# Patient Record
Sex: Female | Born: 1985 | Race: Black or African American | Hispanic: No | Marital: Single | Smoking: Former smoker
Health system: Southern US, Community
[De-identification: ages and names within clinical notes are randomized; demographics above are authoritative.]

## PROBLEM LIST (undated history)

## (undated) HISTORY — PX: TUBAL LIGATION: SHX77

---

## 2015-05-20 ENCOUNTER — Emergency Department (HOSPITAL_COMMUNITY)
Admission: EM | Admit: 2015-05-20 | Discharge: 2015-05-20 | Disposition: A | Discharge status: Home / Self Care | Payer: Medicaid Other | Attending: Emergency Medicine | Admitting: Emergency Medicine

## 2015-05-20 ENCOUNTER — Encounter (HOSPITAL_COMMUNITY): Payer: Self-pay | Admitting: Emergency Medicine

## 2015-05-20 DIAGNOSIS — K0889 Other specified disorders of teeth and supporting structures: Secondary | ICD-10-CM | POA: Diagnosis not present

## 2015-05-20 DIAGNOSIS — K002 Abnormalities of size and form of teeth: Secondary | ICD-10-CM | POA: Diagnosis not present

## 2015-05-20 DIAGNOSIS — K029 Dental caries, unspecified: Secondary | ICD-10-CM | POA: Diagnosis not present

## 2015-05-20 MED ORDER — PENICILLIN V POTASSIUM 500 MG PO TABS: 500.0000 mg | ORAL_TABLET | Freq: Four times a day (QID) | ORAL | Status: AC

## 2015-05-20 MED ORDER — NAPROXEN 500 MG PO TABS: 500.0000 mg | ORAL_TABLET | Freq: Two times a day (BID) | ORAL | Status: DC

## 2015-05-20 NOTE — ED Provider Notes (Signed)
CSN: [id number]     Arrival date & time 05/20/15  1549 History  By signing my name below, I, Budd PalmerVanessa Prueter, attest that this documentation has been prepared under the direction and in the presence of Bear StearnsKayla Caeley Dohrmann, PA-C. Electronically Signed: Budd PalmerVanessa Prueter, ED Scribe. 05/20/2015. 4:30 PM.     Chief Complaint  Patient presents with  . Dental Pain    The patient said she has been having dental pain for two weeks.  It is two upper molars on each side.  She rates her pain 10/10.   The history is provided by the patient. No language interpreter was used.   HPI Comments: Jessica Owen is a 29 y.o. female who presents to the Emergency Department complaining of constant, aching, bilateral upper molar pain radiating up into her temples onset 2 weeks ago. She reports associated gum throbbing and difficulty sleeping due to the pain. She notes exacerbation of the pain with eating and drinking. She reports having tried tylenol and rinsing with salt water, with no relief. She states that she has insurance, but does not have a dentist. Pt denies rhinorrhea, drooling, choking, difficulty swallowing, neck pain, and neck stiffness.  Pt has NKDA.   History reviewed. No pertinent past medical history. History reviewed. No pertinent past surgical history. History reviewed. No pertinent family history. Social History  Substance Use Topics  . Smoking status: Never Smoker   . Smokeless tobacco: Never Used  . Alcohol Use: No   OB History    No data available     Review of Systems A complete 10 system review of systems was obtained and all systems are negative except as noted in the HPI and PMH.   Allergies  Review of patient's allergies indicates not on file.  Home Medications   Prior to Admission medications   Not on File   BP 122/81 mmHg  Pulse 69  Temp(Src) 98.7 F (37.1 C) (Oral)  Resp 16  Ht 5\' 9"  (1.753 m)  Wt 235 lb (106.595 kg)  BMI 34.69 kg/m2  SpO2 100%  LMP 04/19/2015 Physical  Exam  Constitutional: She is oriented to person, place, and time. She appears well-developed and well-nourished.  HENT:  Head: Normocephalic and atraumatic.  Right Ear: External ear normal.  Left Ear: External ear normal.  Nose: Nose normal.  Mouth/Throat: Uvula is midline, oropharynx is clear and moist and mucous membranes are normal. No trismus in the jaw. Abnormal dentition (Patient is missing multiple upper molars with erythema, and TTP.  No active drainage visualized). Dental caries present. No uvula swelling. No oropharyngeal exudate, posterior oropharyngeal edema, posterior oropharyngeal erythema or tonsillar abscesses.  No facial swelling.   Eyes: Conjunctivae are normal. Pupils are equal, round, and reactive to light. Right eye exhibits no discharge. Left eye exhibits no discharge.  Neck: Normal range of motion. Neck supple.  No signs of Ludwigs angina  Cardiovascular: Normal rate, regular rhythm and normal heart sounds.   Pulmonary/Chest: Effort normal and breath sounds normal. No respiratory distress. She has no wheezes. She has no rales.  Abdominal: Soft. Bowel sounds are normal. She exhibits no distension.  Musculoskeletal: Normal range of motion.  Lymphadenopathy:    She has no cervical adenopathy.  Neurological: She is alert and oriented to person, place, and time. Coordination normal.  Skin: Skin is warm and dry. No rash noted. She is not diaphoretic. No erythema.  Psychiatric: She has a normal mood and affect.  Nursing note and vitals reviewed.   ED Course  Procedures  DIAGNOSTIC STUDIES: Oxygen Saturation is 100% on RA, normal by my interpretation.    COORDINATION OF CARE: 4:27 PM - Discussed plans to order antibiotics and naproxen. Will refer to a dentist. Pt advised of plan for treatment and pt agrees.  Labs Review Labs Reviewed - No data to display  Imaging Review No results found. I have personally reviewed and evaluated these images and lab results as part  of my medical decision-making.   EKG Interpretation None      MDM   Final diagnoses:  Pain, dental    Suspect dental pain associated with dental infection and possible dental abscess with patient afebrile, non toxic appearing and swallowing secretions well. I gave patient referral to dentist and stressed the importance of dental follow up for ultimate management of dental pain. Discussed return precautions.  Patient expresses understanding and agrees with plan.  I will also give penicillin VK and pain control.    I personally performed the services described in this documentation, which was scribed in my presence. The recorded information has been reviewed and is accurate.   Cheri Fowler, PA-C 05/20/15 1738  Rolland Porter, MD 05/27/15 [phone]

## 2015-05-20 NOTE — Discharge Instructions (Signed)
Dental Pain  ° ° °Dental pain may be caused by many things, including:  °Tooth decay (cavities or caries). Cavities expose the nerve of your tooth to air and hot or cold temperatures. This can cause pain or discomfort.  °Abscess or infection. A dental abscess is a collection of infected pus from a bacterial infection in the inner part of the tooth (pulp). It usually occurs at the end of the tooth's root.  °Injury.  °An unknown reason (idiopathic). °Your pain may be mild or severe. It may only occur when:  °You are chewing.  °You are exposed to hot or cold temperature.  °You are eating or drinking sugary foods or beverages, such as soda or candy. °Your pain may also be constant.  °HOME CARE INSTRUCTIONS  °Watch your dental pain for any changes. The following actions may help to lessen any discomfort that you are feeling:  °Take medicines only as directed by your dentist.  °If you were prescribed an antibiotic medicine, finish all of it even if you start to feel better.  °Keep all follow-up visits as directed by your dentist. This is important.  °Do not apply heat to the outside of your face.  °Rinse your mouth or gargle with salt water if directed by your dentist. This helps with pain and swelling.  °You can make salt water by adding ¼ tsp of salt to 1 cup of warm water. °Apply ice to the painful area of your face:  °Put ice in a plastic bag.  °Place a towel between your skin and the bag.  °Leave the ice on for 20 minutes, 2-3 times per day. °Avoid foods or drinks that cause you pain, such as:  °Very hot or very cold foods or drinks.  °Sweet or sugary foods or drinks. °SEEK MEDICAL CARE IF:  °Your pain is not controlled with medicines.  °Your symptoms are worse.  °You have new symptoms. °SEEK IMMEDIATE MEDICAL CARE IF:  °You are unable to open your mouth.  °You are having trouble breathing or swallowing.  °You have a fever.  °Your face, neck, or jaw is swollen. °This information is not intended to replace advice  given to you by your health care provider. Make sure you discuss any questions you have with your health care provider.  °Document Released: 06/19/2005 Document Revised: 11/03/2014 Document Reviewed: 06/15/2014  °Elsevier Interactive Patient Education ©2016 Elsevier Inc.  ° ° °Emergency Department Resource Guide °1) Find a Doctor and Pay Out of Pocket °Although you won't have to find out who is covered by your insurance plan, it is a good idea to ask around and get recommendations. You will then need to call the office and see if the doctor you have chosen will accept you as a new patient and what types of options they offer for patients who are self-pay. Some doctors offer discounts or will set up payment plans for their patients who do not have insurance, but you will need to ask so you aren't surprised when you get to your appointment. ° °2) Contact Your Local Health Department °Not all health departments have doctors that can see patients for sick visits, but many do, so it is worth a call to see if yours does. If you don't know where your local health department is, you can check in your phone book. The CDC also has a tool to help you locate your state's health department, and many state websites also have listings of all of their local health departments. ° °  3) Find a Walk-in Clinic °If your illness is not likely to be very severe or complicated, you may want to try a walk in clinic. These are popping up all over the country in pharmacies, drugstores, and shopping centers. They're usually staffed by nurse practitioners or physician assistants that have been trained to treat common illnesses and complaints. They're usually fairly quick and inexpensive. However, if you have serious medical issues or chronic medical problems, these are probably not your best option. ° °No Primary Care Doctor: °- Call [facility] at  832-8000 - they can help you locate a primary care doctor that  accepts your insurance,  provides certain services, etc. °- Physician Referral Service- 1-800-533-3463 ° °Chronic Pain Problems: °Organization         Address  Phone   Notes  °[facility]  (336) 297-2271 Patients need to be referred by their primary care doctor.  ° °Medication Assistance: °Organization         Address  Phone   Notes  °[place] [facility] [address] °[place], [place] [address] (336) 641-8030 --Must be a resident of [place] °-- Must have NO insurance coverage whatsoever (no Medicaid/ Medicare, etc.) °-- The pt. MUST have a primary care doctor that directs their care regularly and follows them in the community °  °[facility]  (866) 331-1348   °[employer]  (888) 892-1162   ° °Agencies that provide inexpensive medical care: °Organization         Address  Phone   Notes  °[facility]  (336) 832-8035   °[facility]    (336) 832-7272   °[facility] [address] °[place], [place] [address] (336) 832-4777   °[facility] [address], °[place] (336) 271-4999   °[facility]    (336) 373-0678   °[facility]    (336) 272-1050   °[facility] ° [address], [place] Phone:  (336) 832-4444, Fax:  (336) 832-4440 Hours of Operation:  9 am - 6 pm, M-F.  Also accepts Medicaid/Medicare and self-pay.  °[facility] ° [address], [place] Phone: (336) 832-3150, Fax: (336) 832-3151. Hours of Operation:  8:30 am - 5:30 pm, M-F.  Also accepts Medicaid and self-pay.  °[facility] [place] [address], [place] Phone: (336) 878-6027   °[facility] [address], [place], [place] (336)723-1848, Ext. 123 Mondays & Thursdays: 7-9 AM.  First 15 patients are seen on a first come, first serve basis. °  ° °Medicaid-accepting [place] Providers: ° °Organization         Address  Phone    Notes  °[facility] [address], [place] (336) 641-2100 Also accepts self-pay patients.  °[facility] [address], [place] ° (336) 856-9996   °[facility] [address], [place] (336) 288-8857   °[facility] [address] [place] [address],  (336) 299-7000   °Veita Bland [address],   ° (336) 373-1557 Only accepts [place] Access Medicaid patients after they have their name applied to their card.  ° °Self-Pay (no insurance) in [place]: ° °Organization         Address  Phone   Notes  °Sickle Cell Patients, [facility] [address], [place] (336) 832-1970   °[facility] [address], [place] (336) 832-4400   °[facility] ° [address], [place] (336) 992-4800   °[facility]/Dr. Osei-Bonsu ° [address] [place] [address], [place] or [address], [place] (336) 841-8500 Phone number for both [place] and [place] locations is the same.  °[facility] [address], [place] (336) 299-0000   °[facility] [address] [place] [address], [place] or [address] (336) 852-7530 °(336) 878-2260   °[facility] [address], [place] (336) 350-1642, phone; (336) 294-5005, fax Sees patients 1st and 3rd Saturday of every month.  Must not qualify for public or private insurance (i.e. Medicaid, Medicare, [place] Health Choice, Veterans' Benefits) • Household income should be no more than 200% of the poverty level •The clinic cannot treat you if you are pregnant or think you are pregnant • Sexually transmitted diseases are not treated at the clinic.  ° ° °Dental Care: °Organization         Address  Phone  Notes  °[facility] [address], [place] (336)  641-6152 Accepts children up to age 21 who are enrolled in Medicaid or [place] Health Choice; pregnant women with a Medicaid card; and children who have applied for Medicaid or [place] Health Choice, but were declined, whose parents can pay a reduced fee at time of service.  °[facility] [place]  [address], [place] (336) 641-7733 Accepts children up to age 21 who are enrolled in Medicaid or [place] Health Choice; pregnant women with a Medicaid card; and children who have applied for Medicaid or [place] Health Choice, but were declined, whose parents can pay a reduced fee at time of service.  °[facility] ° [address], [place] (336) 641-4533 Patients are seen by appointment only. Walk-ins are not accepted. [facility] will see patients 18 years of age and older. °Monday - Tuesday (8am-5pm) °Most Wednesdays (8:30-5pm) °$30 per visit, cash only  °[facility] ° [address], [place] (336) 641-4533 Patients are seen by appointment only. Walk-ins are not accepted. [facility] will see patients 18 years of age and older. °One Wednesday Evening (Monthly: Volunteer Based).  $30 per visit, cash only  °[facility]  (919) 537-3737 for adults; Children under age 4, call Graduate Pediatric Dentistry at (919) [phone]. Children aged 4-14, please call (919) 537-3737 to request a pediatric application. ° Dental services are provided in all areas of dental care including fillings, crowns and bridges, complete and partial dentures, implants, gum treatment, root canals, and extractions. Preventive care is also provided. Treatment is provided to both adults and children. °Patients are selected via a lottery and there is often a waiting list. °  °[facility] [address], °[place] ° (336) 763-8833 [email or web address] °  °[facility] [address], [place], [place] (336)723-1848, Ext.  123 Second and Fourth Thursday of each month, opens at 6:30 AM; Clinic ends at 9 AM.  Patients are seen on a first-come first-served basis, and a limited number are seen during each clinic.  ° °[facility] ° [address], [place], [place] (336) 723-7904   Eligibility Requirements °You must have lived in [place],   [place], or [place] counties for at least the last three months. °  You cannot be eligible for state or federal sponsored healthcare insurance, including Veterans Administration, Medicaid, or Medicare. °  You generally cannot be eligible for healthcare insurance through your employer.  °  How to apply: °Eligibility screenings are held every Tuesday and Wednesday afternoon from 1:00 pm until 4:00 pm. You do not need an appointment for the interview!  °[facility] [address], [place], [place] 336-631-2330   °[facility]  336-342-8273   °[facility]  336-703-3100   °[facility]  336-570-6415   ° °Behavioral Health Resources in the Community: °Intensive Outpatient Programs °Organization         Address  Phone  Notes  °[facility] [address], [place], [place] 336-878-6098   °[facility] [address], [place], [place] 336-832-9800   °[facility] [address], [place], [place] ° 336-882-2125   °[facility] [address],  °[place], [place] 1-800-853-5163 or 336-641-4981   °Substance Abuse Resources °Organization         Address  Phone  Notes  °[facility]  336-882-2125   °[facility]  336-784-9470   °[facility]  336-285-9073   °[facility]  336-845-3988   °Residential & Outpatient Substance Abuse Program  1-800-659-3381   °Psychological Services °Organization         Address  Phone  Notes  °[facility]  336- 832-9600   °[facility]  336- 378-7881   °[place] [address], [place] 1-800-853-5163 or 336-641-4981   ° °Mobile Crisis Teams °Organization         Address  Phone  Notes  °[facility], Mobile Crisis Care Unit  1-877-626-1772   °Assertive °[facility] ° [address] [place], [place] 336-834-9664   °Sharon DeEsch [address] °[place] 336-554-5454   ° °Self-Help/Support Groups °Organization         Address  Phone             Notes  °[facility] - variety of support groups  336- 373-1402 Call for more information  °Narcotics Anonymous (NA), [facility] [address], °[place]  2 meetings at this location  ° °Residential Treatment Programs °Organization         Address  Phone  Notes  °[facility] [address],    °[place]  1-866-801-8205   °[facility] ° [address], [place], [place] 704-293-8524   °[facility] [address], [place] 336-845-3988 Admissions: 8am-3pm M-F  °[facility] [address]    °[place], [place] 336-841-1104   °[facility] [address], [place], [place] 336-379-7146   °[facility] [address]  °[place], [place] 336-285-9073   °[facility] Intensive Outpatient [address], [place], [place] 336-852-3033   °[facility] [address]  °[place], [place] 1-877-615-2722 or 336-784-9470   °[facility] [address] [place], [place] 336-227-7417 Accepts Medicaid  °[facility] [address]  °[place] 1-800-659-3381 Substance Abuse/Addiction Treatment  ° °[place] Behavioral Health Resources °Organization         Address  Phone  Notes  °[facility]  (888) 581-9988   °Julie Brannon, PhD [address] [place], [place]   (336) 349-5553 or (336) 951-0000   °[facility]   [address] °[place], [place] (336) 349-4454   °[facility] [address], [place], [place] (336) 342-8316 Insurance/Medicaid/sponsorship through [facility]  °[facility] [address]                                    [place], [place] (336) 342-8316 Therapy/tele-psych/case  °[facility] [address]  ° [place], [place] (336) 349-2233    °Dr. Arfeen  (336) 349-4544   °[facility] [place]  [employer] [place] [facility] 1) [address], [place] °2) [address], [place] °3)  [address], [place] (336) 349-3220 °(336) 342-7768 ° °(336) 342-8140   °[place] Child Abuse [facility] (336) 342-1394 or (336) 342-3537 (After Hours)    ° ° ° °

## 2015-05-20 NOTE — ED Notes (Signed)
The patient said she has been having dental pain for two weeks.  It is two upper molars on each side.  She rates her pain 10/10.  She has taken tylenol and it is not working.

## 2015-05-20 NOTE — ED Notes (Signed)
Patient is alert and orientedx4.  Patient was explained discharge instructions and they understood them with no questions.   

## 2015-07-10 ENCOUNTER — Encounter (HOSPITAL_COMMUNITY): Payer: Self-pay

## 2015-07-10 ENCOUNTER — Emergency Department (HOSPITAL_COMMUNITY)
Admission: EM | Admit: 2015-07-10 | Discharge: 2015-07-10 | Disposition: A | Discharge status: Home / Self Care | Payer: Medicaid Other | Attending: Emergency Medicine | Admitting: Emergency Medicine

## 2015-07-10 DIAGNOSIS — K0889 Other specified disorders of teeth and supporting structures: Secondary | ICD-10-CM | POA: Diagnosis present

## 2015-07-10 DIAGNOSIS — Z791 Long term (current) use of non-steroidal anti-inflammatories (NSAID): Secondary | ICD-10-CM | POA: Insufficient documentation

## 2015-07-10 MED ORDER — ACETAMINOPHEN-CODEINE #3 300-30 MG PO TABS: 1.0000 | ORAL_TABLET | Freq: Four times a day (QID) | ORAL | Status: DC | PRN

## 2015-07-10 NOTE — ED Notes (Signed)
Pt here with c/o left sided dental pain, top and bottom. She had a tooth removed a week before Christmas but reports it is still painful with no relief from OTC meds. Pt also reports nasal congestion.

## 2015-07-10 NOTE — ED Provider Notes (Signed)
CSN: [id number]     Arrival date & time 07/10/15  1657 History  By signing my name below, I, Lyndel Safe, attest that this documentation has been prepared under the direction and in the presence of Fayrene Helper, PA-C.  Electronically Signed: Lyndel Safe, ED Scribe. 07/10/2015. 5:22 PM.  Chief Complaint  Patient presents with  . Dental Pain   The history is provided by the patient. No language interpreter was used.   HPI Comments: Jessica Owen is a 30 y.o. female who presents to the Emergency Department complaining of constant, worsening left upper dentalgia to the site of a tooth that was extracted 3 weeks ago due to dental decay. Pt followed up with the dentist who performed the extraction 2 days ago when packing was placed in the area due to dry socket. She has been taking ibuprofen and tylenol, per dentist advice, without relief. She has also been using salt water gargles without relief. She denies fever, chills, rhinorrhea, otalgia, or hearing loss. Pt states she is not satisfied with her dentist and is requesting a referral to another dentist.   History reviewed. No pertinent past medical history. History reviewed. No pertinent past surgical history. No family history on file. Social History  Substance Use Topics  . Smoking status: Never Smoker   . Smokeless tobacco: Never Used  . Alcohol Use: No   OB History    No data available     Review of Systems  Constitutional: Negative for fever and chills.  HENT: Positive for dental problem. Negative for ear pain, hearing loss and rhinorrhea.    Allergies  Review of patient's allergies indicates not on file.  Home Medications   Prior to Admission medications   Medication Sig Start Date End Date Taking? Authorizing Provider  naproxen (NAPROSYN) 500 MG tablet Take 1 tablet (500 mg total) by mouth 2 (two) times daily. 05/20/15   Kayla Rose, PA-C   BP 131/81 mmHg  Pulse 80  Temp(Src) 98.5 F (36.9 C) (Oral)  Resp 18  SpO2  100% Physical Exam  Constitutional: She is oriented to person, place, and time. She appears well-developed and well-nourished.  HENT:  Head: Normocephalic and atraumatic.  Mouth/Throat: Uvula is midline and oropharynx is clear and moist. No trismus in the jaw. Abnormal dentition. No dental abscesses or uvula swelling.  Tenderness to the gum line at the sight of tooth #14 which has been previously extracted, packing is in place. No gingival erythema, no obvious abscess noted, no signs of deep tissue infection. Ears normal bilaterally. No lymphadenopathy.   Cardiovascular: Normal rate and regular rhythm.   No murmur heard. Pulmonary/Chest: Effort normal and breath sounds normal. No respiratory distress.  Abdominal: Soft. There is no tenderness. There is no rebound and no guarding.  Musculoskeletal: She exhibits no edema or tenderness.  Neurological: She is alert and oriented to person, place, and time.  Skin: Skin is warm and dry.  Psychiatric: She has a normal mood and affect. Her behavior is normal.  Nursing note and vitals reviewed.   ED Course  Procedures  DIAGNOSTIC STUDIES: Oxygen Saturation is 100% on RA, normal by my interpretation.    COORDINATION OF CARE: 5:21 PM Discussed treatment plan which includes to provide pt with dental referral. Pt acknowledges and agrees to plan.   MDM   Final diagnoses:  None    Patient with dentalgia.  No abscess requiring immediate incision and drainage.  Exam not concerning for Ludwig's angina or pharyngeal abscess.  Will treat  with Tylenol #3. Pt instructed to follow-up with dentist.  Discussed return precautions. Pt safe for discharge.   I personally performed the services described in this documentation, which was scribed in my presence. The recorded information has been reviewed and is accurate.   BP 131/81 mmHg  Pulse 80  Temp(Src) 98.5 F (36.9 C) (Oral)  Resp 18  SpO2 100%   Fayrene HelperBowie Rossetta Kama, PA-C 07/10/15 1724  Melene Planan Floyd,  DO 07/10/15 2216

## 2015-07-10 NOTE — Discharge Instructions (Signed)

## 2015-07-10 NOTE — ED Notes (Signed)
Pt A&OX4, ambulatory at d/c with steady gait, NAD 

## 2015-07-14 ENCOUNTER — Emergency Department (HOSPITAL_COMMUNITY)
Admission: EM | Admit: 2015-07-14 | Discharge: 2015-07-14 | Disposition: A | Discharge status: Home / Self Care | Payer: Medicaid Other | Attending: Emergency Medicine | Admitting: Emergency Medicine

## 2015-07-14 ENCOUNTER — Encounter (HOSPITAL_COMMUNITY): Payer: Self-pay

## 2015-07-14 DIAGNOSIS — Z791 Long term (current) use of non-steroidal anti-inflammatories (NSAID): Secondary | ICD-10-CM | POA: Diagnosis not present

## 2015-07-14 DIAGNOSIS — E669 Obesity, unspecified: Secondary | ICD-10-CM | POA: Diagnosis not present

## 2015-07-14 DIAGNOSIS — M273 Alveolitis of jaws: Secondary | ICD-10-CM | POA: Insufficient documentation

## 2015-07-14 DIAGNOSIS — R51 Headache: Secondary | ICD-10-CM | POA: Insufficient documentation

## 2015-07-14 DIAGNOSIS — K0889 Other specified disorders of teeth and supporting structures: Secondary | ICD-10-CM | POA: Diagnosis present

## 2015-07-14 MED ORDER — KETOROLAC TROMETHAMINE 60 MG/2ML IM SOLN
60.0000 mg | Freq: Once | INTRAMUSCULAR | Status: AC
  Administered 2015-07-14: 60 mg via INTRAMUSCULAR
  Filled 2015-07-14: qty 2

## 2015-07-14 MED ORDER — IBUPROFEN 800 MG PO TABS: 800.0000 mg | ORAL_TABLET | Freq: Three times a day (TID) | ORAL | Status: DC

## 2015-07-14 MED ORDER — OXYCODONE-ACETAMINOPHEN 5-325 MG PO TABS
1.0000 | ORAL_TABLET | Freq: Once | ORAL | Status: AC
  Administered 2015-07-14: 1 via ORAL
  Filled 2015-07-14: qty 1

## 2015-07-14 MED ORDER — CLINDAMYCIN HCL 150 MG PO CAPS: 150.0000 mg | ORAL_CAPSULE | Freq: Four times a day (QID) | ORAL | Status: DC

## 2015-07-14 NOTE — Discharge Instructions (Signed)
Dental Dry Socket °After a tooth is pulled (extracted), blood fills up the hole (socket) where the tooth once was. This blood hardens (clots) and protects the bone and nerves underneath. Normally, gums completely grow over the top of the bones and nerves and close an open socket in about a week. After several months, the clot is replaced by bone that grows into the socket. °However, when blood does not fill up the extraction socket, or the blood clot is lost for some reason, a dry socket may form. This condition leaves the bone and nerves exposed to air, food, liquid, or anything else that enters the mouth. The gums cannot grow over the extraction socket because there is nothing to grow over, and the dry socket remains open.  °CAUSES  °· Blood not filling up the extraction socket properly. °· Anything that can dislodge a forming blood clot. Forceful spitting or sucking through a straw can pull a blood clot completely out of the socket and cause a dry socket. °· Having a difficult extraction. The forceful pushing against the wall of the socket when the tooth is extracted causes the walls of the tooth socket to become crushed. This prevents bleeding into the socket because the blood vessels have been crushed closed. °· Alcoholic drinks may dry out the blood clot and cause a dry socket. °· Smoking can disturb blood clot formation and cause a dry socket. °Factors that put you at an increased risk for a dry socket include:  °· Having lower teeth extracted. °· Being female. °· Poor oral hygiene. °· Taking birth control pills. °· Having your wisdom teeth extracted. °SYMPTOMS °· Severe, constant, dull throbbing pain. The pain generally begins 2 to 3 days after the tooth extraction. The pain may last about a week after it begins. °· Bad smelling breath and bad taste in your mouth. °· Ear pain. °HOME CARE INSTRUCTIONS °· Follow your dentist's instructions. °· Only take over-the-counter or prescription medicines for pain,  discomfort, or fever as directed by your caregiver. If pain medication does not relieve the pain, you may need to see your dentist who can clean the socket and place a medicated dressing on the extraction to promote healing. °· Take your antibiotics as told, if prescribed. Finish them even if you start to feel better. °· Wait at least a day before rinsing with warm salt water to avoid possibly dissolving the new blood clot. When salt water rinsing, spit gently to avoid pressure on the clot. °· Avoid carbonated beverages. °· Avoid alcohol. °· Avoid smoking for a few days after surgery. °Patients who have recently had oral surgery should avoid anything that may irritate the extraction socket or anything that may cause the blood clot inside the extraction socket from being dislodged. Carefully follow your instructions for after surgery care.  °SEEK IMMEDIATE DENTAL CARE IF: °· Your medicine does not relieve pain. °· You have uncontrolled bleeding, marked swelling, or severe pain. °· You develop a fever above 102° F (38.9° C), not controlled by medication. °· You have difficulty swallowing or cannot open your mouth. °· You have other severe symptoms. °  °This information is not intended to replace advice given to you by your health care provider. Make sure you discuss any questions you have with your health care provider. °  °Document Released: 12/24/2002 Document Revised: 09/11/2011 Document Reviewed: 02/01/2015 °Elsevier Interactive Patient Education ©2016 [employer] ° °

## 2015-07-14 NOTE — ED Provider Notes (Signed)
CSN: [id number]     Arrival date & time 07/14/15  0419 History   First MD Initiated Contact with Patient 07/14/15 0448     Chief Complaint  Patient presents with  . Dental Pain     (Consider location/radiation/quality/duration/timing/severity/associated sxs/prior Treatment) HPI  This is a 30 year old female who presents with persistent dental pain. Patient reports that she had a dental extraction and resultant dry socket. She has been seen in the ER as well as by her dentist and has been referred to an oral surgeon. She reports that her dentist took out the packing yesterday and reported "no signs of infection." She is taking ibuprofen 400 mg when necessary and Tylenol with Codeine which she states is not helping with her pain. Currently she rates her pain a 10 out of 10. She reports pain in her left upper mouth and "my head feels like it's about to explode." She denies fever, difficulty swallowing, hearing loss. Patient reports that her dentist referred her to an oral surgeon but she cannot be seen until later this month. She states "breathing through my nose even increases the pain."  History reviewed. No pertinent past medical history. History reviewed. No pertinent past surgical history. History reviewed. No pertinent family history. Social History  Substance Use Topics  . Smoking status: Never Smoker   . Smokeless tobacco: Never Used  . Alcohol Use: No   OB History    No data available     Review of Systems  Constitutional: Negative for fever.  HENT: Positive for dental problem. Negative for ear pain, trouble swallowing and voice change.   Neurological: Positive for headaches. Negative for dizziness.  All other systems reviewed and are negative.     Allergies  Review of patient's allergies indicates no known allergies.  Home Medications   Prior to Admission medications   Medication Sig Start Date End Date Taking? Authorizing Provider  acetaminophen-codeine (TYLENOL #3)  300-30 MG tablet Take 1-2 tablets by mouth every 6 (six) hours as needed for moderate pain or severe pain. 07/10/15   Fayrene Helper, PA-C  clindamycin (CLEOCIN) 150 MG capsule Take 1 capsule (150 mg total) by mouth every 6 (six) hours. 07/14/15   Shon Baton, MD  ibuprofen (ADVIL,MOTRIN) 800 MG tablet Take 1 tablet (800 mg total) by mouth 3 (three) times daily. 07/14/15   Shon Baton, MD  naproxen (NAPROSYN) 500 MG tablet Take 1 tablet (500 mg total) by mouth 2 (two) times daily. 05/20/15   Kayla Rose, PA-C   BP 139/95 mmHg  Pulse 73  Temp(Src) 98 F (36.7 C) (Oral)  Resp 19  Ht 5\' 10"  (1.778 m)  Wt 230 lb (104.327 kg)  BMI 33.00 kg/m2  SpO2 99% Physical Exam  Constitutional: She is oriented to person, place, and time. No distress.  Obese, no acute distress  HENT:  Head: Normocephalic and atraumatic.  Absent tooth #14 was dry socket noted, no obvious abscess, no trismus, uvula midline, no fullness under the tongue, normal phonation  Eyes: Pupils are equal, round, and reactive to light.  Cardiovascular: Normal rate and regular rhythm.   Pulmonary/Chest: Effort normal. No respiratory distress.  Neurological: She is alert and oriented to person, place, and time.  Skin: Skin is warm and dry.  Psychiatric: She has a normal mood and affect.  Nursing note and vitals reviewed.   ED Course  Procedures (including critical care time) Labs Review Labs Reviewed - No data to display  Imaging Review No results found.  I have personally reviewed and evaluated these images and lab results as part of my medical decision-making.   EKG Interpretation None      MDM   Final diagnoses:  Dry socket    Patient presents with persistent dental pain. She is not consistently taking anti-inflammatories and states that Tylenol with Codeine is not helping. There is no obvious infection. Pain is likely related to dry socket and nerve pain as it is sensitive to cold. Discussed with patient  scheduled anti-inflammatories at home including 800 mg of ibuprofen 3 times a day. She will be given Toradol and one dose of Percocet here for pain management. There are no signs of symptoms of infection. However, patient is concerned about this. She will be given a short course of clindamycin. No evidence of Ludwigs.  Patient encouraged follow-up closely with dentist and oral surgery.  After history, exam, and medical workup I feel the patient has been appropriately medically screened and is safe for discharge home. Pertinent diagnoses were discussed with the patient. Patient was given return precautions.     Shon Batonourtney F Zaylon Bossier, MD 07/14/15 0530

## 2015-07-14 NOTE — ED Notes (Signed)
Pt here for dental pain, had a tooth removed before christmas and also had dry socket and reports no releif with meds given, also states issues with breathing through nose, sts that cant get in with MD until the end of month. sts her brain is hurting.

## 2015-08-03 ENCOUNTER — Encounter (HOSPITAL_COMMUNITY): Payer: Self-pay | Admitting: Emergency Medicine

## 2015-08-03 ENCOUNTER — Emergency Department (HOSPITAL_COMMUNITY)
Admission: EM | Admit: 2015-08-03 | Discharge: 2015-08-03 | Disposition: A | Discharge status: Home / Self Care | Payer: Medicaid Other | Attending: Emergency Medicine | Admitting: Emergency Medicine

## 2015-08-03 DIAGNOSIS — N898 Other specified noninflammatory disorders of vagina: Secondary | ICD-10-CM | POA: Diagnosis not present

## 2015-08-03 DIAGNOSIS — L292 Pruritus vulvae: Secondary | ICD-10-CM | POA: Diagnosis present

## 2015-08-03 DIAGNOSIS — Z3202 Encounter for pregnancy test, result negative: Secondary | ICD-10-CM | POA: Insufficient documentation

## 2015-08-03 LAB — WET PREP, GENITAL
Clue Cells Wet Prep HPF POC: NONE SEEN
SPERM: NONE SEEN
TRICH WET PREP: NONE SEEN
Yeast Wet Prep HPF POC: NONE SEEN

## 2015-08-03 LAB — URINALYSIS, ROUTINE W REFLEX MICROSCOPIC
Bilirubin Urine: NEGATIVE
Glucose, UA: NEGATIVE mg/dL
HGB URINE DIPSTICK: NEGATIVE
KETONES UR: NEGATIVE mg/dL
Leukocytes, UA: NEGATIVE
Nitrite: NEGATIVE
PROTEIN: NEGATIVE mg/dL
Specific Gravity, Urine: 1.017 (ref 1.005–1.030)
pH: 7 (ref 5.0–8.0)

## 2015-08-03 LAB — POC URINE PREG, ED: PREG TEST UR: NEGATIVE

## 2015-08-03 MED ORDER — FLUCONAZOLE 100 MG PO TABS
150.0000 mg | ORAL_TABLET | Freq: Once | ORAL | Status: AC
  Administered 2015-08-03: 150 mg via ORAL
  Filled 2015-08-03: qty 2

## 2015-08-03 NOTE — ED Notes (Signed)
Earney Mallet, PA, at bedside.

## 2015-08-03 NOTE — ED Notes (Signed)
Pt reports vaginal itching with discharge x 3 days.  Pt reports recent antibiotic use.  Pt reports using monistat past 2 days without relief.

## 2015-08-03 NOTE — Discharge Instructions (Signed)
Monilial Vaginitis   Vaginitis in a soreness, swelling and redness (inflammation) of the vagina and vulva. Monilial vaginitis is not a sexually transmitted infection.  CAUSES  Yeast vaginitis is caused by yeast (candida) that is normally found in your vagina. With a yeast infection, the candida has overgrown in number to a point that upsets the chemical balance.  SYMPTOMS  White, thick vaginal discharge.  Swelling, itching, redness and irritation of the vagina and possibly the lips of the vagina (vulva).  Burning or painful urination.  Painful intercourse. DIAGNOSIS  Things that may contribute to monilial vaginitis are:  Postmenopausal and virginal states.  Pregnancy.  Infections.  Being tired, sick or stressed, especially if you had monilial vaginitis in the past.  Diabetes. Good control will help lower the chance.  Birth control pills.  Tight fitting garments.  Using bubble bath, feminine sprays, douches or deodorant tampons.  Taking certain medications that kill germs (antibiotics).  Sporadic recurrence can occur if you become ill. TREATMENT  Your caregiver will give you medication.  There are several kinds of anti monilial vaginal creams and suppositories specific for monilial vaginitis. For recurrent yeast infections, use a suppository or cream in the vagina 2 times a week, or as directed.  Anti-monilial or steroid cream for the itching or irritation of the vulva may also be used. Get your caregiver's permission.  Painting the vagina with methylene blue solution may help if the monilial cream does not work.  Eating yogurt may help prevent monilial vaginitis. HOME CARE INSTRUCTIONS  Finish all medication as prescribed.  Do not have sex until treatment is completed or after your caregiver tells you it is okay.  Take warm sitz baths.  Do not douche.  Do not use tampons, especially scented ones.  Wear cotton underwear.  Avoid tight pants and panty hose.  Tell your sexual partner  that you have a yeast infection. They should go to their caregiver if they have symptoms such as mild rash or itching.  Your sexual partner should be treated as well if your infection is difficult to eliminate.  Practice safer sex. Use condoms.  Some vaginal medications cause latex condoms to fail. Vaginal medications that harm condoms are:  Cleocin cream.  Butoconazole (Femstat).  Terconazole (Terazol) vaginal suppository.  Miconazole (Monistat) (may be purchased over the counter). SEEK MEDICAL CARE IF:  You have a temperature by mouth above 102 F (38.9 C).  The infection is getting worse after 2 days of treatment.  The infection is not getting better after 3 days of treatment.  You develop blisters in or around your vagina.  You develop vaginal bleeding, and it is not your menstrual period.  You have pain when you urinate.  You develop intestinal problems.  You have pain with sexual intercourse. This information is not intended to replace advice given to you by your health care provider. Make sure you discuss any questions you have with your health care provider.  Document Released: 03/29/2005 Document Revised: 09/11/2011 Document Reviewed: 12/21/2014  Elsevier Interactive Patient Education 2016 [employer].    Emergency Department Resource Guide 1) Find a Doctor and Pay Out of Pocket Although you won't have to find out who is covered by your insurance plan, it is a good idea to ask around and get recommendations. You will then need to call the office and see if the doctor you have chosen will accept you as a new patient and what types of options they offer for patients who are  self-pay. Some doctors offer discounts or will set up payment plans for their patients who do not have insurance, but you will need to ask so you aren't surprised when you get to your appointment.  2) Contact Your Local Health Department Not all health departments have doctors that can see patients for sick  visits, but many do, so it is worth a call to see if yours does. If you don't know where your local health department is, you can check in your phone book. The CDC also has a tool to help you locate your state's health department, and many state websites also have listings of all of their local health departments.  3) Find a Walk-in Clinic If your illness is not likely to be very severe or complicated, you may want to try a walk in clinic. These are popping up all over the country in pharmacies, drugstores, and shopping centers. They're usually staffed by nurse practitioners or physician assistants that have been trained to treat common illnesses and complaints. They're usually fairly quick and inexpensive. However, if you have serious medical issues or chronic medical problems, these are probably not your best option.  No Primary Care Doctor: - Call Health Connect at  [phone] - they can help you locate a primary care doctor that  accepts your insurance, provides certain services, etc. - Physician Referral Service- [phone]  Chronic Pain Problems: Organization         Address  Phone   Notes  [facility]  [phone] Patients need to be referred by their primary care doctor.   Medication Assistance: Organization         Address  Phone   Notes  [facility] Medication [facility] [address] [place] [place], [place] [address] [phone] --Must be a resident of [facility] -- Must have NO insurance coverage whatsoever (no Medicaid/ Medicare, etc.) -- The pt. MUST have a primary care doctor that directs their care regularly and follows them in the community   MedAssist  [phone]   [employer]  [phone]    Agencies that provide inexpensive medical care: Organization         Address  Phone   Notes  [facility]  [phone]   [facility]    [phone]   [facility] [address] [place], [place] [address] [phone]   [facility] [address] [place], [place] [phone]   Planned Parenthood    [phone]   [facility]    [phone]   [facility]  [address], [place] Phone:  [phone], Fax:  [phone] Hours of Operation:  9 am - 6 pm, M-F.  Also accepts Medicaid/Medicare and self-pay.  [facility]  [address], [place] Phone: [phone], Fax: [phone]. Hours of Operation:  8:30 am - 5:30 pm, M-F.  Also accepts Medicaid and self-pay.  [facility] [place] [address] [place] Phone: [phone]   [facility] [address] Natasha Bence [place], [place] [phone], Ext. [phone] Mondays & Thursdays: 7-9 AM.  First 15 patients are seen on a first come, first serve basis.    Medicaid-accepting [facility] Providers:  Organization         Address  Phone   Notes  [facility] [address], Ervin Knack, [place] [phone] Also accepts self-pay patients.  [facility] [address] Laurell Josephs [place], [place]  [phone]   [facility] [address], [place] [phone]   [facility] [address] [place] [phone]   Renaye Rakers [address], [place]   [phone] Only accepts [place] Access [place] patients after they have their name applied to their card.   Self-Pay (no insurance) in [facility]:  Organization         Address  Phone   Notes  Sickle Cell Patients, [facility] [address] [place], [place] [phone]   [facility] [address] [place], [place] [phone]   [facility] [place]  [address], [place] [phone]   [facility]/Dr. Osei-Bonsu  [address]  [place] or [address], [place] [phone] Phone number for both [place] and [place] locations is the same.  [facility] [address] [place] [phone]   [facility] [address], [place] or [address] [phone]   [facility] [address] [place] [phone], phone; [phone], fax Sees patients 1st and 3rd Saturday of every month.  Must not qualify for public or private insurance (i.e. Medicaid, Medicare, [place] Health Choice, Veterans' Benefits)  Household income should be no more than 200% of the poverty level The clinic cannot treat you if you are pregnant or think you are pregnant  Sexually transmitted diseases are not treated at the clinic.    Dental Care: Organization         Address  Phone  Notes  [facility] Department of [facility] [address] [place], [place] [phone] Accepts children up to age 68 who are enrolled in [place] or [place] Health Choice; pregnant women with a Medicaid card; and children who have applied for Medicaid or [place] Health Choice, but were declined, whose parents can pay a reduced fee at time of service.  [facility] Department of [facility]  [address], [place] [phone] Accepts children up to age 39 who are enrolled in [place] or [place] Health Choice; pregnant women with a Medicaid card; and children who have applied for Medicaid or  Health Choice, but were declined, whose parents can pay a reduced fee at time of service.  [facility]  [address] [place], [place] [phone] Patients are seen by appointment only. Walk-ins are not accepted. [facility] will see patients 51 years of age and older. Monday - Tuesday (8am-5pm) Most Wednesdays (8:30-5pm) $30 per visit, cash only  [facility] Adult Dental Access PRO[facility]  [address], [facility] [phone] Patients are seen by appointment only. Walk-ins are not accepted. [facility] will see patients 58 years of age and older. One Wednesday Evening (Monthly: Volunteer Based).  $30 per visit, cash only  [employer]  [phone] for adults; Children under age 78, call Graduate Pediatric Dentistry at [phone]. Children aged 23-14, please call [phone] to request a pediatric application.  Dental services are provided in all areas of dental care including fillings, crowns and bridges, complete and partial dentures, implants, gum treatment, root canals, and  extractions. Preventive care is also provided. Treatment is provided to both adults and children. Patients are selected via a lottery and there is often a waiting list.   [facility] [address] [place]  [phone] [email or web address]   [facility] [address] [place], [place] [phone], Ext. [phone] Second and Fourth Thursday of each month, opens at 6:30 AM; Clinic ends at 9 AM.  Patients are seen on a first-come first-served basis, and a limited number are seen during each clinic.   [facility]  [address] Ether Griffins [place], [place] [phone]   Eligibility Requirements You must have lived in [place], [place], or [place] counties for at least the last three months.   You cannot be eligible for state or federal sponsored [place], including [employer], [place], or [employer].   You generally cannot be eligible for healthcare insurance through your employer.    How to apply: Eligibility screenings are held every Tuesday and Wednesday afternoon from 1:00 pm until 4:00 pm. You do not need an appointment for the interview!  [facility] [address], [place], [place] [phone]   [facility] Health Department  [phone]   [facility] Health Department  [phone]   [facility] Health Department  [phone]    Behavioral Health Resources in the Community: Intensive Outpatient Programs Organization         Address  Phone  Notes  [facility] [address] [place], [place] [phone]   [facility] Outpatient [address] [place], [place] [phone]   [facility] [address], [place], [place]  [phone]   [facility] Mental Health [address],  [place], [place] [phone] or [phone]   Substance Abuse Resources Organization         Address  Phone  Notes  [facility]  [phone]   [facility]  [phone]   [facility]  [phone]   Floydene Flock  [phone]   Residential & Outpatient Substance Abuse Program  [phone]   Psychological Services Organization         Address  Phone  Notes  [facility] Behavioral Health  [phone]   [facility]  [phone]   [facility] Mental Health [address] [place] [phone] or [phone]    Mobile Crisis Teams Organization         Address  Phone  Notes  [facility], Mobile Crisis Care Unit  [phone]   [facility]  [address] [place], [place] [phone]   Doristine Locks [address] [place] [phone]    Self-Help/Support Groups Organization         Address  Phone             Notes  [facility] [place] - variety of support groups  [phone]- [phone] Call for more information  Narcotics Anonymous (NA), [facility] [address], [employer] [place]  2 meetings at this location   Statistician         Address  Phone  Notes  [facility] [address],    [place]  [phone]   [facility]  [address] [place] [address], [place], [place] [phone]   [facility] [address] [place], [place]  [phone] Admissions: 8am-3pm M-F  [facility] [address]    [place], [place] [phone]   [facility] [address], [place], [place] [phone]   [facility] [address]  [place], [place] [phone]   [facility] - Intensive Outpatient [address], [place], [place] [phone]   [facility] ([facility] [address]  [place], [place] [phone] or [phone]   [facility] ([facility]) [address] [place], [place] [phone] Accepts Medicaid  [facility] [address]  [place] [phone] Substance Abuse/Addiction Treatment   [facility] Behavioral Health Resources Organization         Address  Phone  Notes  [facility]  [phone](888) [phone]   Angie FavaJulie Brannon, PhD [address] [place] [address] [place], [place]   [phone]) [phone] or [phone](336) [phone]   [facility]    [address] [place], [place] [phone](336) [phone]   [facility] [address], [place], [place] [phone] Insurance/Medicaid/sponsorship through [facility]  [facility] [address]                                    [place], [place] [phone]) [phone] Therapy/tele-psych/case  [facility] [address].   [place], [place] [phone]) [phone]    Dr. Lolly MustacheArfeen  [phone]) [phone]   [facility]  [employer] [facility] 1) [address], [place] 2) [address], [place] 3)  [address], [place] [phone]) [phone]) [phone]  [phone](336) [phone]   [facility] Child Abuse Hotline [phone]) [phone] or [phone](336) [phone] (After Hours)

## 2015-08-03 NOTE — ED Provider Notes (Signed)
CSN: [id number]     Arrival date & time 08/03/15  4098 History   First MD Initiated Contact with Patient 08/03/15 0920     Chief Complaint  Patient presents with  . Vaginal Itching     (Consider location/radiation/quality/duration/timing/severity/associated sxs/prior Treatment) HPI   Jessica Owen is a 30 y.o. female with PMH significant for tubal ligation and cesarean section who presents with 3 day history of constant, moderate, progressively worsening vaginal discharge and itching.  She has difficulty specifying her discharge, but states that it is definitely different and possible white and chunky.  She reports she has tried monistat and soaking in hot bathes with minimal relief.  She reports recent abx use with Clindamycin and Penicillin for wisdom tooth extraction.  She denies any vaginal bleeding, fever, chills, abdominal pain, urinary symptoms, or sore throat.  She is not concerned about STDs and reports she is in a monogamous relationship.   History reviewed. No pertinent past medical history. Past Surgical History  Procedure Laterality Date  . Cesarean section    . Tubal ligation     No family history on file. Social History  Substance Use Topics  . Smoking status: Never Smoker   . Smokeless tobacco: Never Used  . Alcohol Use: No   OB History    No data available     Review of Systems  All other systems negative unless otherwise stated in HPI   Allergies  Review of patient's allergies indicates no known allergies.  Home Medications   Prior to Admission medications   Medication Sig Start Date End Date Taking? Authorizing Provider  acetaminophen-codeine (TYLENOL #3) 300-30 MG tablet Take 1-2 tablets by mouth every 6 (six) hours as needed for moderate pain or severe pain. Patient not taking: Reported on 08/03/2015 07/10/15   Fayrene Helper, PA-C  clindamycin (CLEOCIN) 150 MG capsule Take 1 capsule (150 mg total) by mouth every 6 (six) hours. Patient not taking:  Reported on 08/03/2015 07/14/15   Shon Baton, MD  ibuprofen (ADVIL,MOTRIN) 800 MG tablet Take 1 tablet (800 mg total) by mouth 3 (three) times daily. Patient not taking: Reported on 08/03/2015 07/14/15   Shon Baton, MD  naproxen (NAPROSYN) 500 MG tablet Take 1 tablet (500 mg total) by mouth 2 (two) times daily. Patient not taking: Reported on 08/03/2015 05/20/15   Cheri Fowler, PA-C   BP 134/86 mmHg  Pulse 87  Temp(Src) 98.6 F (37 C)  Resp 16  Ht  (1.778 m)  Wt 102.513 kg  BMI 32.43 kg/m2  SpO2 99%  LMP 07/12/2015 (Approximate) Physical Exam  Constitutional: She is oriented to person, place, and time. She appears well-developed and well-nourished.  Non-toxic appearance. She does not have a sickly appearance. She does not appear ill.  HENT:  Head: Normocephalic and atraumatic.  Mouth/Throat: Oropharynx is clear and moist.  Eyes: Conjunctivae are normal. Pupils are equal, round, and reactive to light.  Neck: Normal range of motion. Neck supple.  Cardiovascular: Normal rate, regular rhythm and normal heart sounds.   No murmur heard. Pulmonary/Chest: Effort normal and breath sounds normal. No accessory muscle usage or stridor. No respiratory distress. She has no wheezes. She has no rhonchi. She has no rales.  Abdominal: Soft. Bowel sounds are normal. She exhibits no distension. There is no tenderness.  Genitourinary: Uterus normal. There is no rash, tenderness or lesion on the right labia. There is no rash, tenderness or lesion on the left labia. Cervix exhibits discharge (copious white). Cervix  exhibits no motion tenderness. Right adnexum displays no tenderness. Left adnexum displays no tenderness. No erythema, tenderness or bleeding in the vagina. No foreign body around the vagina. Vaginal discharge (white) found.  Musculoskeletal: Normal range of motion.  Lymphadenopathy:    She has no cervical adenopathy.  Neurological: She is alert and oriented to person, place, and  time.  Speech clear without dysarthria.  Skin: Skin is warm and dry.  Psychiatric: She has a normal mood and affect. Her behavior is normal.    ED Course  Procedures (including critical care time) Labs Review Labs Reviewed  WET PREP, GENITAL - Abnormal; Notable for the following:    WBC, Wet Prep HPF POC MODERATE (*)    All other components within normal limits  URINALYSIS, ROUTINE W REFLEX MICROSCOPIC (NOT AT [facility])  POC URINE PREG, ED  GC/CHLAMYDIA PROBE AMP ([place]) NOT AT [facility]    Imaging Review No results found. I have personally reviewed and evaluated these images and lab results as part of my medical decision-making.   EKG Interpretation None      MDM   Final diagnoses:  Vaginal discharge  Vaginal itching    Patient presents with findings consistent with vaginal candidiasis.  VSS, NAD.  Patient non-septic or ill appearing.  On exam, abdomen soft and benign.  No CMT or adnexal tenderness.  White vaginal and cervical discharge present.  Doubt TOA, PID, or torsion.  Wet prep unremarkable.  UA negative. Urine pregnancy negative. Will treat with one time by mouth dose of Diflucan given patient's history. Patient advised to follow-up with [facility]. Discussed return precautions. Patient agrees and acknowledges the above plan for discharge.    Cheri Fowler, PA-C 08/03/15 1121  Tomasita Crumble, MD 08/03/15 2230

## 2015-08-04 LAB — GC/CHLAMYDIA PROBE AMP (~~LOC~~) NOT AT ARMC
CHLAMYDIA, DNA PROBE: NEGATIVE
NEISSERIA GONORRHEA: NEGATIVE

## 2015-12-13 ENCOUNTER — Encounter (HOSPITAL_COMMUNITY): Payer: Self-pay | Admitting: *Deleted

## 2015-12-13 ENCOUNTER — Emergency Department (HOSPITAL_COMMUNITY)
Admission: EM | Admit: 2015-12-13 | Discharge: 2015-12-13 | Disposition: A | Discharge status: Home / Self Care | Payer: Medicaid Other | Attending: Emergency Medicine | Admitting: Emergency Medicine

## 2015-12-13 DIAGNOSIS — Z792 Long term (current) use of antibiotics: Secondary | ICD-10-CM | POA: Insufficient documentation

## 2015-12-13 DIAGNOSIS — R21 Rash and other nonspecific skin eruption: Secondary | ICD-10-CM | POA: Diagnosis present

## 2015-12-13 DIAGNOSIS — Z791 Long term (current) use of non-steroidal anti-inflammatories (NSAID): Secondary | ICD-10-CM | POA: Diagnosis not present

## 2015-12-13 DIAGNOSIS — K120 Recurrent oral aphthae: Secondary | ICD-10-CM | POA: Diagnosis not present

## 2015-12-13 DIAGNOSIS — L509 Urticaria, unspecified: Secondary | ICD-10-CM | POA: Diagnosis not present

## 2015-12-13 MED ORDER — HYDROCORTISONE 0.5 % EX OINT: 1.0000 "application " | TOPICAL_OINTMENT | Freq: Two times a day (BID) | CUTANEOUS | Status: DC

## 2015-12-13 NOTE — Discharge Instructions (Signed)
Canker Sores Canker sores are small, painful sores that develop inside your mouth. They may also be called aphthous ulcers. You can get canker sores on the inside of your lips or cheeks, on your tongue, or anywhere inside your mouth. You can have just one canker sore or several of them. Canker sores cannot be passed from one person to another (noncontagious). These sores are different than the sores that you may get on the outside of your lips (cold sores or fever blisters). Canker sores usually start as painful red bumps. Then they turn into small white, yellow, or gray ulcers that have red borders. The ulcers may be quite painful. The pain may be worse when you eat or drink. CAUSES The cause of this condition is not known. RISK FACTORS This condition is more likely to develop in:  Women.  People in their teens or 5620s.  Women who are having their menstrual period.  People who are under a lot of emotional stress.  People who do not get enough iron or B vitamins.  People who have poor oral hygiene.  People who have an injury inside the mouth. This can happen after having dental work or from chewing something hard. SYMPTOMS Along with the canker sore, symptoms may also include:  Fever.  Fatigue.  Swollen lymph nodes in your neck. DIAGNOSIS This condition can be diagnosed based on your symptoms. Your health care provider will also examine your mouth. Your health care provider may also do tests if you get canker sores often or if they are very bad. Tests may include:  Blood tests to rule out other causes of canker sores.  Taking swabs from the sore to check for infection.  Taking a small piece of skin from the sore (biopsy) to test it for cancer. TREATMENT Most canker sores clear up without treatment in about 10 days. Home care is usually the only treatment that you will need. Over-the-counter medicines can relieve discomfort.If you have severe canker sores, your health care  provider may prescribe:  Numbing ointment to relieve pain.  Vitamins.  Steroid medicines. These may be given as:  Oral pills.  Mouth rinses.  Gels.  Antibiotic mouth rinse. HOME CARE INSTRUCTIONS  Apply, take, or use medicines only as directed by your health care provider. These include vitamins.  If you were prescribed an antibiotic mouth rinse, finish all of it even if you start to feel better.  Until the sores are healed:  Do not drink coffee or citrus juices.  Do not eat spicy or salty foods.  Use a mild, over-the-counter mouth rinse as directed by your health care provider.  Practice good oral hygiene.  Floss your teeth every day.  Brush your teeth with a soft brush twice each day. SEEK MEDICAL CARE IF:  Your symptoms do not get better after two weeks.  You also have a fever or swollen glands.  You get canker sores often.  You have a canker sore that is getting larger.  You cannot eat or drink due to your canker sores.   This information is not intended to replace advice given to you by your health care provider. Make sure you discuss any questions you have with your health care provider.   Document Released: 10/14/2010 Document Revised: 11/03/2014 Document Reviewed: 05/20/2014 Elsevier Interactive Patient Education 2016 [employer]  Hives Hives are itchy, red, swollen areas of the skin. They can vary in size and location on your body. Hives can come and go for  hours or several days (acute hives) or for several weeks (chronic hives). Hives do not spread from person to person (noncontagious). They may get worse with scratching, exercise, and emotional stress. CAUSES   Allergic reaction to food, additives, or drugs.  Infections, including the common cold.  Illness, such as vasculitis, lupus, or thyroid disease.  Exposure to sunlight, heat, or cold.  Exercise.  Heat.  Cold.  Vibration  Physical touch.  Stress.  Contact with  chemicals. SYMPTOMS   Red or white swollen patches on the skin. The patches may change size, shape, and location quickly and repeatedly.  Itching.  Swelling of the hands, feet, and face. This may occur if hives develop deeper in the skin. DIAGNOSIS  Your caregiver can usually tell what is wrong by performing a physical exam. Skin or blood tests may also be done to determine the cause of your hives. In some cases, the cause cannot be determined. TREATMENT  Mild cases usually get better with medicines such as antihistamines. Severe cases may require an emergency epinephrine injection. If the cause of your hives is known, treatment includes avoiding that trigger.  HOME CARE INSTRUCTIONS   Avoid causes that trigger your hives.  Take antihistamines as directed by your caregiver to reduce the severity of your hives. Non-sedating or low-sedating antihistamines are usually recommended. Do not drive while taking an antihistamine.  Take any other medicines prescribed for itching as directed by your caregiver.  Wear loose-fitting clothing.  Keep all follow-up appointments as directed by your caregiver. SEEK MEDICAL CARE IF:   You have persistent or severe itching that is not relieved with medicine.  You have painful or swollen joints. SEEK IMMEDIATE MEDICAL CARE IF:   You have a fever.  Your tongue or lips are swollen.  You have trouble breathing or swallowing.  You feel tightness in the throat or chest.  You have abdominal pain. These problems may be the first sign of a life-threatening allergic reaction. Call your local emergency services (911 in [place] MAKE SURE YOU:   Understand these instructions.  Will watch your condition.  Will get help right away if you are not doing well or get worse.   This information is not intended to replace advice given to you by your health care provider. Make sure you discuss any questions you have with your health care provider.   Document  Released: 06/19/2005 Document Revised: 06/24/2013 Document Reviewed: 09/12/2011 Elsevier Interactive Patient Education [employer]

## 2015-12-13 NOTE — ED Notes (Signed)
Pt c/o rash to her inner thighs, check, neck, and upper lip. Pt states the rash comes and goes. Pt states the rash looks like mosquito bites.

## 2015-12-13 NOTE — ED Provider Notes (Signed)
CSN: [id number]     Arrival date & time 12/13/15  1814 History  By signing my name below, I, Freida Busmaniana Omoyeni, attest that this documentation has been prepared under the direction and in the presence of non-physician practitioner, Arthor CaptainAbigail Hitoshi Werts, PA-C. Electronically Signed: Freida Busmaniana Omoyeni, Scribe. 12/13/2015. 9:59 PM.    Chief Complaint  Patient presents with  . Rash   The history is provided by the patient. No language interpreter was used.    HPI Comments:  Jessica Owen is a 30 y.o. female who presents to the Emergency Department complaining of an intermittent, pruritic, rash x ~ 1 week.  She notes rash has developed on her chest, inner thighs, neck, and upper lip. She describes the rash as welts. She denies h/o allergic reaction. No alleviating factors noted.   History reviewed. No pertinent past medical history. Past Surgical History  Procedure Laterality Date  . Cesarean section    . Tubal ligation     History reviewed. No pertinent family history. Social History  Substance Use Topics  . Smoking status: Never Smoker   . Smokeless tobacco: Never Used  . Alcohol Use: No   OB History    No data available     Review of Systems  Constitutional: Negative for fever.  Skin: Positive for rash.   Allergies  Review of patient's allergies indicates no known allergies.  Home Medications   Prior to Admission medications   Medication Sig Start Date End Date Taking? Authorizing Provider  acetaminophen-codeine (TYLENOL #3) 300-30 MG tablet Take 1-2 tablets by mouth every 6 (six) hours as needed for moderate pain or severe pain. Patient not taking: Reported on 08/03/2015 07/10/15   Fayrene HelperBowie Tran, PA-C  clindamycin (CLEOCIN) 150 MG capsule Take 1 capsule (150 mg total) by mouth every 6 (six) hours. Patient not taking: Reported on 08/03/2015 07/14/15   Shon Batonourtney F Horton, MD  ibuprofen (ADVIL,MOTRIN) 800 MG tablet Take 1 tablet (800 mg total) by mouth 3 (three) times daily. Patient not taking:  Reported on 08/03/2015 07/14/15   Shon Batonourtney F Horton, MD  naproxen (NAPROSYN) 500 MG tablet Take 1 tablet (500 mg total) by mouth 2 (two) times daily. Patient not taking: Reported on 08/03/2015 05/20/15   Cheri FowlerKayla Rose, PA-C   BP 125/83 mmHg  Pulse 73  Temp(Src) 99.2 F (37.3 C) (Oral)  Resp 19  SpO2 100% Physical Exam  Constitutional: She is oriented to person, place, and time. She appears well-developed and well-nourished. No distress.  HENT:  Head: Normocephalic and atraumatic.  Small ulceration noted to upper lip without swelling; airway is clear   Eyes: Conjunctivae are normal.  Cardiovascular: Normal rate.   Pulmonary/Chest: Effort normal.  Abdominal: She exhibits no distension.  Neurological: She is alert and oriented to person, place, and time.  Skin: Skin is warm and dry.  Psychiatric: She has a normal mood and affect.  Nursing note and vitals reviewed.   ED Course  Procedures  DIAGNOSTIC STUDIES:  Oxygen Saturation is 100% on  RA, normal by my interpretation.    COORDINATION OF CARE:  9:56 PM Discussed treatment plan with pt at bedside and pt agreed to plan.   MDM  Patient with urticarial eruption. No specific trigger. Discussed that urticaria can be trigger by stress, heat, cold, and for unknown reasons. No signs of anaphylactic reaction; no new medications. Will treat with hydrocortisone ointment. Follow up with PCP in 2-3 days. Return precautions discussed. Pt is safe for discharge at this time.   Final diagnoses:  Hives of unknown origin  Oral aphthous ulcer    I personally performed the services described in this documentation, which was scribed in my presence. The recorded information has been reviewed and is accurate.         Arthor Captain, PA-C 12/13/15 2212  Tilden Fossa, MD 12/14/15 [phone]

## 2016-08-31 ENCOUNTER — Encounter (HOSPITAL_COMMUNITY): Payer: Self-pay | Admitting: Emergency Medicine

## 2016-08-31 ENCOUNTER — Emergency Department (HOSPITAL_COMMUNITY)
Admission: EM | Admit: 2016-08-31 | Discharge: 2016-08-31 | Disposition: A | Discharge status: Home / Self Care | Payer: PRIVATE HEALTH INSURANCE | Attending: Emergency Medicine | Admitting: Emergency Medicine

## 2016-08-31 DIAGNOSIS — W010XXA Fall on same level from slipping, tripping and stumbling without subsequent striking against object, initial encounter: Secondary | ICD-10-CM | POA: Insufficient documentation

## 2016-08-31 DIAGNOSIS — S86002A Unspecified injury of left Achilles tendon, initial encounter: Secondary | ICD-10-CM | POA: Diagnosis not present

## 2016-08-31 DIAGNOSIS — Y929 Unspecified place or not applicable: Secondary | ICD-10-CM | POA: Diagnosis not present

## 2016-08-31 DIAGNOSIS — Y999 Unspecified external cause status: Secondary | ICD-10-CM | POA: Diagnosis not present

## 2016-08-31 DIAGNOSIS — S99912A Unspecified injury of left ankle, initial encounter: Secondary | ICD-10-CM | POA: Diagnosis present

## 2016-08-31 DIAGNOSIS — Y939 Activity, unspecified: Secondary | ICD-10-CM | POA: Diagnosis not present

## 2016-08-31 MED ORDER — IBUPROFEN 200 MG PO TABS
600.0000 mg | ORAL_TABLET | Freq: Once | ORAL | Status: AC
  Administered 2016-08-31: 600 mg via ORAL
  Filled 2016-08-31: qty 3

## 2016-08-31 MED ORDER — ACETAMINOPHEN 325 MG PO TABS
650.0000 mg | ORAL_TABLET | Freq: Once | ORAL | Status: AC
  Administered 2016-08-31: 650 mg via ORAL
  Filled 2016-08-31: qty 2

## 2016-08-31 NOTE — Discharge Instructions (Signed)
Your posterior ankle pain is most likely due to acute injury of your Achilles tendon, you most likely overstretched it during your fall. We will treat this with a cam walker to provide stability and support for the next 7 days. Please wear this boot for the next 5 days.  Please avoid aggravating activities, place ice 3 times a day and elevate foot to decrease inflammation and swelling. Take 600 mg of ibuprofen for the inflammation and pain 3 times a day for the next 5 days. After 7 days of wearing the Cam Walker you may attempt to walk, if you are no longer having pain you may discontinue using the Cam Walker. If after 7 days you are still sore, but the Cam Walker back on and wear it for an additional 7 days.  You may stop wearing the Cam Walker as soon as you can tolerate the pain. Follow up with orthopedist if you have continued pain after two weeks for a re-evaluation.

## 2016-08-31 NOTE — ED Triage Notes (Addendum)
[facility] EMS[facility]o [facility] ankle pain after falling. No deformity or swelling noted. Denies head injury and LOC.

## 2016-08-31 NOTE — ED Notes (Signed)
Pt in a hurry to leave. Endorses no time to sign out or get d/c vitals. Ambulated out of the ED in her cam walker without issue.

## 2016-08-31 NOTE — ED Provider Notes (Signed)
[facility]-EMERGENCY DEPT Provider Note   CSN: [id number] Arrival date & time: 08/31/16  1458  By signing my name below, I, Freida Busmaniana Omoyeni, attest that this documentation has been prepared under the direction and in the presence of Sharen Hecklaudia Gibbons, PA-C. Electronically Signed: Freida Busmaniana Omoyeni, Scribe. 08/31/2016. 3:48 PM.  History   Chief Complaint Chief Complaint  Patient presents with  . Ankle Pain   The history is provided by the patient. No language interpreter was used.    HPI Comments:  Jessica Owen is a 31 y.o. female who presents to the Emergency Department via EMS complaining of 6/10, left ankle s/p mechanical fall today. Pt states she slipped on the wet floor and fell; no LOC or head injury. Patient cannot tell me how she landed or how she twisted her ankle.  She states she has not attempted to ambulate since the fall as the ambulance was called and she was helped onto the stretcher to the ED. No alleviating factors noted. Pt has no other acute complaints or associated symptoms at this time.    History reviewed. No pertinent past medical history.  There are no active problems to display for this patient.   Past Surgical History:  Procedure Laterality Date  . CESAREAN SECTION    . TUBAL LIGATION      OB History    No data available       Home Medications    Prior to Admission medications   Medication Sig Start Date End Date Taking? Authorizing Provider  acetaminophen-codeine (TYLENOL #3) 300-30 MG tablet Take 1-2 tablets by mouth every 6 (six) hours as needed for moderate pain or severe pain. Patient not taking: Reported on 08/03/2015 07/10/15   Fayrene HelperBowie Tran, PA-C  clindamycin (CLEOCIN) 150 MG capsule Take 1 capsule (150 mg total) by mouth every 6 (six) hours. Patient not taking: Reported on 08/03/2015 07/14/15   Shon Batonourtney F Horton, MD  hydrocortisone ointment 0.5 % Apply 1 application topically 2 (two) times daily. 12/13/15   Arthor CaptainAbigail Harris, PA-C  ibuprofen (ADVIL,MOTRIN) 800  MG tablet Take 1 tablet (800 mg total) by mouth 3 (three) times daily. Patient not taking: Reported on 08/03/2015 07/14/15   Shon Batonourtney F Horton, MD  naproxen (NAPROSYN) 500 MG tablet Take 1 tablet (500 mg total) by mouth 2 (two) times daily. Patient not taking: Reported on 08/03/2015 05/20/15   Cheri FowlerKayla Rose, PA-C    Family History History reviewed. No pertinent family history.  Social History Social History  Substance Use Topics  . Smoking status: Never Smoker  . Smokeless tobacco: Never Used  . Alcohol use No     Allergies   Patient has no known allergies.   Review of Systems Review of Systems  Constitutional: Negative for fever.  HENT: Negative for congestion.   Respiratory: Negative for cough and shortness of breath.   Cardiovascular: Negative for chest pain.  Genitourinary: Negative for difficulty urinating.  Musculoskeletal: Positive for arthralgias and gait problem. Negative for back pain, joint swelling and myalgias.  Skin: Negative for color change and wound.  Neurological: Negative for dizziness, syncope, light-headedness, numbness and headaches.   Physical Exam Updated Vital Signs BP 136/89 (BP Location: Right Arm)   Pulse 85   Temp 99.5 F (37.5 C) (Oral)   Resp 16   SpO2 100%   Physical Exam  Constitutional: She is oriented to person, place, and time. She appears well-developed and well-nourished. No distress.  HENT:  Head: Normocephalic and atraumatic.  Right Ear: External ear normal.  Left Ear: External ear normal.  Nose: Nose normal.  Mouth/Throat: Oropharynx is clear and moist. No oropharyngeal exudate.  Eyes: Conjunctivae and EOM are normal. Pupils are equal, round, and reactive to light. No scleral icterus.  Neck: Normal range of motion.  Cardiovascular: Normal rate, regular rhythm and normal heart sounds.   No murmur heard. Pulmonary/Chest: Effort normal and breath sounds normal. She has no wheezes.  Musculoskeletal: Normal range of motion. She  exhibits tenderness. She exhibits no deformity.  Tenderness over medial aspect of left achilles tendon.  Negative Thompson test.  Full active ROM of LEFT ankle.  Full passive ROM of LEFT ankle without crepitus.  No obvious deformity of ankles or feet including ecchymosis, edema, erythema or skin abrasions. Patient able to bear weight in ED (4+ steps).   No bony tenderness over posterior aspect of lateral and medial malleoli, navicular bone or 5th metatarsal base.   Negative anterior and posterior drawer. Negative Talar tilt. Negative syndesmosis squeeze test.   Neurological: She is alert and oriented to person, place, and time.  Skin: Skin is warm and dry. Capillary refill takes less than 2 seconds.  Psychiatric: She has a normal mood and affect. Her behavior is normal. Judgment and thought content normal.  Nursing note and vitals reviewed.    ED Treatments / Results  DIAGNOSTIC STUDIES:  Oxygen Saturation is 100% on RA, normal by my interpretation.    COORDINATION OF CARE:  3:44 PM Discussed treatment plan with pt at bedside and pt agreed to plan.  Labs (all labs ordered are listed, but only abnormal results are displayed) Labs Reviewed - No data to display  EKG  EKG Interpretation None       Radiology No results found.  Procedures Procedures (including critical care time)  Medications Ordered in ED Medications  ibuprofen (ADVIL,MOTRIN) tablet 600 mg (600 mg Oral Given 08/31/16 1619)  acetaminophen (TYLENOL) tablet 650 mg (650 mg Oral Given 08/31/16 1618)     Initial Impression / Assessment and Plan / ED Course  I have reviewed the triage vital signs and the nursing notes.  Pertinent labs & imaging results that were available during my care of the patient were reviewed by me and considered in my medical decision making (see chart for details).    Pt presents with left achilles tendon tenderness after mechanical fall PTA.  Ankle cleared with [place] ankle rules,  x-rays not indicated. I personally ambulated patient who was able to take 4+ steps in the ED without assist with mild reported pain.  Patient given cam boot for possible achilles tendon injury.  Advised to RICE and take ibuprofen + tylenol for pain.  Patient declined crutches. Pt advised to follow up with orthopedics if she continues having pain.  Patient will be discharged home & is agreeable with above plan. Returns precautions discussed. Pt appears safe for discharge.  Final Clinical Impressions(s) / ED Diagnoses   Final diagnoses:  Achilles tendon injury, left, initial encounter    New Prescriptions Discharge Medication List as of 08/31/2016  4:15 PM     I personally performed the services described in this documentation, which was scribed in my presence. The recorded information has been reviewed and is accurate.    Liberty Handy, PA-C 09/03/16 2136    Mancel Bale, MD 09/04/16 [phone]

## 2016-10-11 ENCOUNTER — Emergency Department (HOSPITAL_COMMUNITY): Payer: Medicaid Other

## 2016-10-11 ENCOUNTER — Emergency Department (HOSPITAL_COMMUNITY)
Admission: EM | Admit: 2016-10-11 | Discharge: 2016-10-11 | Disposition: A | Discharge status: Home / Self Care | Payer: Medicaid Other | Attending: Emergency Medicine | Admitting: Emergency Medicine

## 2016-10-11 ENCOUNTER — Encounter (HOSPITAL_COMMUNITY): Payer: Self-pay

## 2016-10-11 DIAGNOSIS — Y939 Activity, unspecified: Secondary | ICD-10-CM | POA: Diagnosis not present

## 2016-10-11 DIAGNOSIS — S60012A Contusion of left thumb without damage to nail, initial encounter: Secondary | ICD-10-CM | POA: Insufficient documentation

## 2016-10-11 DIAGNOSIS — Y929 Unspecified place or not applicable: Secondary | ICD-10-CM | POA: Diagnosis not present

## 2016-10-11 DIAGNOSIS — Z79899 Other long term (current) drug therapy: Secondary | ICD-10-CM | POA: Diagnosis not present

## 2016-10-11 DIAGNOSIS — Y999 Unspecified external cause status: Secondary | ICD-10-CM | POA: Diagnosis not present

## 2016-10-11 DIAGNOSIS — S60019A Contusion of unspecified thumb without damage to nail, initial encounter: Secondary | ICD-10-CM
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
## 2016-10-11 NOTE — ED Triage Notes (Signed)
Pt presents to the ed with complaints that she fractured her thumbs, refuses to tell rn how she injured her thumbs, states that she will tell the doctor she does not have to tell this rn. Mild swelling, no obvious deformity.

## 2016-10-11 NOTE — ED Notes (Signed)
Pt stable, understands discharge instructions, and reasons for return.   

## 2016-10-21 ENCOUNTER — Emergency Department (HOSPITAL_COMMUNITY)
Admission: EM | Admit: 2016-10-21 | Discharge: 2016-10-21 | Disposition: A | Discharge status: Home / Self Care | Payer: Medicaid Other | Attending: Emergency Medicine | Admitting: Emergency Medicine

## 2016-10-21 ENCOUNTER — Encounter (HOSPITAL_COMMUNITY): Payer: Self-pay | Admitting: Emergency Medicine

## 2016-10-21 DIAGNOSIS — T782XXA Anaphylactic shock, unspecified, initial encounter: Secondary | ICD-10-CM | POA: Insufficient documentation

## 2016-10-21 DIAGNOSIS — Z79899 Other long term (current) drug therapy: Secondary | ICD-10-CM | POA: Insufficient documentation

## 2016-10-21 DIAGNOSIS — T7840XA Allergy, unspecified, initial encounter: Secondary | ICD-10-CM

## 2016-10-21 LAB — COMPREHENSIVE METABOLIC PANEL
ALBUMIN: 3.3 g/dL — AB (ref 3.5–5.0)
ALT: 13 U/L — AB (ref 14–54)
ANION GAP: 7 (ref 5–15)
AST: 20 U/L (ref 15–41)
Alkaline Phosphatase: 52 U/L (ref 38–126)
BUN: 6 mg/dL (ref 6–20)
CHLORIDE: 111 mmol/L (ref 101–111)
CO2: 21 mmol/L — ABNORMAL LOW (ref 22–32)
Calcium: 8.8 mg/dL — ABNORMAL LOW (ref 8.9–10.3)
Creatinine, Ser: 0.74 mg/dL (ref 0.44–1.00)
GFR calc non Af Amer: >60 mL/min (ref >60)
GLUCOSE: 127 mg/dL — AB (ref 65–99)
Potassium: 4.3 mmol/L (ref 3.5–5.1)
SODIUM: 139 mmol/L (ref 135–145)
Total Bilirubin: 0.4 mg/dL (ref 0.3–1.2)
Total Protein: 6.8 g/dL (ref 6.5–8.1)

## 2016-10-21 LAB — CBC WITH DIFFERENTIAL/PLATELET
BASOS PCT: 0 %
Basophils Absolute: 0 10*3/uL (ref 0.0–0.1)
EOS ABS: 0 10*3/uL (ref 0.0–0.7)
EOS PCT: 0 %
HCT: 37.5 % (ref 36.0–46.0)
HEMOGLOBIN: 11 g/dL — AB (ref 12.0–15.0)
LYMPHS ABS: 1.6 10*3/uL (ref 0.7–4.0)
Lymphocytes Relative: 24 %
MCH: 22.9 pg — AB (ref 26.0–34.0)
MCHC: 29.3 g/dL — AB (ref 30.0–36.0)
MCV: 78 fL (ref 78.0–100.0)
Monocytes Absolute: 0.4 10*3/uL (ref 0.1–1.0)
Monocytes Relative: 6 %
NEUTROS PCT: 70 %
Neutro Abs: 4.5 10*3/uL (ref 1.7–7.7)
PLATELETS: 293 10*3/uL (ref 150–400)
RBC: 4.81 MIL/uL (ref 3.87–5.11)
RDW: 15.3 % (ref 11.5–15.5)
WBC: 6.4 10*3/uL (ref 4.0–10.5)

## 2016-10-21 MED ORDER — FAMOTIDINE 20 MG PO TABS: 20.0000 mg | ORAL_TABLET | Freq: Two times a day (BID) | ORAL | 0 refills | Status: DC

## 2016-10-21 MED ORDER — DIPHENHYDRAMINE HCL 25 MG PO CAPS: 25.0000 mg | ORAL_CAPSULE | Freq: Four times a day (QID) | ORAL | 0 refills | Status: DC | PRN

## 2016-10-21 MED ORDER — SODIUM CHLORIDE 0.9 % IV BOLUS (SEPSIS)
1000.0000 mL | Freq: Once | INTRAVENOUS | Status: AC
  Administered 2016-10-21: 1000 mL via INTRAVENOUS

## 2016-10-21 MED ORDER — PREDNISONE 50 MG PO TABS: 50.0000 mg | ORAL_TABLET | Freq: Every day | ORAL | 0 refills | Status: DC

## 2016-10-21 MED ORDER — DIPHENHYDRAMINE HCL 50 MG/ML IJ SOLN
25.0000 mg | Freq: Once | INTRAMUSCULAR | Status: AC
  Administered 2016-10-21: 25 mg via INTRAVENOUS
  Filled 2016-10-21: qty 1

## 2016-10-21 MED ORDER — EPINEPHRINE 0.3 MG/0.3ML IJ SOAJ: 0.3000 mg | Freq: Once | INTRAMUSCULAR | 1 refills | Status: AC

## 2016-10-21 MED ORDER — FAMOTIDINE IN NACL 20-0.9 MG/50ML-% IV SOLN
20.0000 mg | Freq: Once | INTRAVENOUS | Status: AC
  Administered 2016-10-21: 20 mg via INTRAVENOUS
  Filled 2016-10-21: qty 50

## 2016-10-21 MED ORDER — METHYLPREDNISOLONE SODIUM SUCC 125 MG IJ SOLR
125.0000 mg | Freq: Once | INTRAMUSCULAR | Status: AC
  Administered 2016-10-21: 125 mg via INTRAVENOUS
  Filled 2016-10-21: qty 2

## 2016-10-21 NOTE — ED Provider Notes (Signed)
[facility]EM[facility]Y DEPT Provider Note   CSN: [id number] Arrival date & time: 10/21/16  0212     History   Chief Complaint Chief Complaint  Patient presents with  . Allergic Reaction    HPI Jessica Owen is a 31 y.o. female.  The history is provided by the patient.  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 No pertinent past medical history.  There are no active problems to display for this patient.   Past Surgical History:  Procedure Laterality Date  . CESAREAN SECTION    . TUBAL LIGATION      OB History    No data available       Home Medications    Prior to Admission medications   Medication Sig Start Date End Date Taking? Authorizing Provider  diphenhydrAMINE (BENADRYL) 25 mg capsule Take 25 mg by mouth every 6 (six) hours as needed for itching or allergies.   Yes Historical Provider, MD  acetaminophen-codeine (TYLENOL #3) 300-30 MG tablet Take 1-2 tablets by mouth every 6 (six) hours as needed for moderate pain or severe pain. Patient not taking: Reported on 08/03/2015 07/10/15   Fayrene Helper, PA-C  clindamycin (CLEOCIN) 150 MG capsule Take 1 capsule (150 mg total) by mouth every 6 (six) hours. Patient not taking: Reported on 08/03/2015 07/14/15   Shon Baton, MD  hydrocortisone ointment 0.5 % Apply 1 application topically 2 (two) times daily. Patient not taking: Reported on 10/21/2016 12/13/15   Arthor Captain, PA-C  ibuprofen (ADVIL,MOTRIN) 800 MG tablet Take 1 tablet  (800 mg total) by mouth 3 (three) times daily. Patient not taking: Reported on 08/03/2015 07/14/15   Shon Baton, MD  naproxen (NAPROSYN) 500 MG tablet Take 1 tablet (500 mg total) by mouth 2 (two) times daily. Patient not taking: Reported on 08/03/2015 05/20/15   Cheri Fowler, PA-C    Family History No family history on file.  Social History Social History  Substance Use Topics  . Smoking status: Never Smoker  . Smokeless tobacco: Never Used  . Alcohol use No     Allergies   Patient has no known allergies.   Review of Systems Review of Systems  Constitutional: Negative for chills, diaphoresis, fatigue and fever.  HENT: Positive for facial swelling. Negative for congestion, rhinorrhea, sore throat, trouble swallowing and voice change.   Eyes: Positive for itching.  Respiratory: Negative for cough, chest tightness, shortness of breath, wheezing and stridor.   Cardiovascular: Negative for chest pain and palpitations.  Gastrointestinal: Negative for constipation, diarrhea, nausea and vomiting.  Genitourinary: Negative for dysuria and flank pain.  Musculoskeletal: Negative for back pain, neck pain and neck stiffness.  Skin: Positive for itching and rash. Negative for wound.  Neurological: Negative for syncope, light-headedness and headaches.  Psychiatric/Behavioral: Negative for agitation and confusion.  All other systems reviewed and are negative.    Physical Exam Updated Vital Signs BP 116/65   Pulse 90   Temp 98.9 F (37.2 C) (Oral)   Resp 16   Ht  (1.778 m)  Wt 230 lb (104.3 kg)   LMP 09/30/2016   SpO2 99%   BMI 33.00 kg/m   Physical Exam  Constitutional: She is oriented to person, place, and time. She appears well-developed and well-nourished. No distress.  HENT:  Head: Atraumatic.  Right Ear: External ear normal.  Left Ear: External ear normal.  Nose: Nose normal.  Mouth/Throat: Oropharynx is clear and moist. No oropharyngeal exudate.    Generalized facial edema and lip swelling. No tongue swelling or stridor.     Eyes: Conjunctivae and EOM are normal. Pupils are equal, round, and reactive to light.  Neck: Normal range of motion. Neck supple.  Cardiovascular: Normal rate, regular rhythm, normal heart sounds and intact distal pulses.   No murmur heard. Pulmonary/Chest: Effort normal and breath sounds normal. No stridor. No respiratory distress. She has no wheezes. She has no rales. She exhibits no tenderness.  Abdominal: Soft. She exhibits no distension. There is no tenderness. There is no rebound.  Musculoskeletal: She exhibits no tenderness.  Neurological: She is alert and oriented to person, place, and time. She has normal reflexes. No sensory deficit. She exhibits normal muscle tone.  Skin: Skin is warm. Capillary refill takes less than 2 seconds. Rash noted. Rash is urticarial. She is not diaphoretic. No erythema.  Intermittent urticarial rash on torso and arms.   Psychiatric: She has a normal mood and affect.  Nursing note and vitals reviewed.    ED Treatments / Results  Labs (all labs ordered are listed, but only abnormal results are displayed) Labs Reviewed  CBC WITH DIFFERENTIAL/PLATELET - Abnormal; Notable for the following:       Result Value   Hemoglobin 11.0 (*)    MCH 22.9 (*)    MCHC 29.3 (*)    All other components within normal limits  COMPREHENSIVE METABOLIC PANEL - Abnormal; Notable for the following:    CO2 21 (*)    Glucose, Bld 127 (*)    Calcium 8.8 (*)    Albumin 3.3 (*)    ALT 13 (*)    All other components within normal limits    EKG  EKG Interpretation None       Radiology No results found.  Procedures Procedures (including critical care time)  Medications Ordered in ED Medications  diphenhydrAMINE (BENADRYL) injection 25 mg (25 mg Intravenous Given 10/21/16 0812)  methylPREDNISolone sodium succinate (SOLU-MEDROL) 125 mg/2 mL injection 125 mg (125 mg Intravenous Given  10/21/16 0813)  famotidine (PEPCID) IVPB 20 mg premix (0 mg Intravenous Stopped 10/21/16 0843)  sodium chloride 0.9 % bolus 1,000 mL (0 mLs Intravenous Stopped 10/21/16 0917)     Initial Impression / Assessment and Plan / ED Course  I have reviewed the triage vital signs and the nursing notes.  Pertinent labs & imaging results that were available during my care of the patient were reviewed by me and considered in my medical decision making (see chart for details).     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 Patient says that she woke up this morning with allergic reaction with pruritic rash on torso and arms. Patient also reports worsening edema to face and lip swelling. Patient denies shortness of breath and stridor. Patient denies voice change. She denies nausea, syncope, or lightheadedness. Patient denies any new chemical exposures, new foods, or detergents. Patient denies close.  Patient reports taking Benadryl at home without relief. Patient presents for further evaluation.  On exam, patient's lungs showed no wheezing. No stridor. Patient has edema to the face and lips with no trismus. Mild urticaria rash on torso and arms.  Based on symptoms, suspects anaphylaxis. Without stridor, difficulty breathing, and no tongue swelling, will hold on epinephrine. Patient given steroids and antihistamines. Patient had screening blood testing to rule out kidney failure as cause of facial edema.  Lab testing reassuring.  After medication, patient had resolution of symptoms. Patient observed a period of time without recurrence of reaction. Patient given prescription for EpiPen, Benadryl, Pepcid, and prednisone. Patient also given instructions for PCP follow-up. Patient will need allergy testing. Patient understood return precautions for any new or worsened symptoms. Patient discharged good condition.   Final  Clinical Impressions(s) / ED Diagnoses   Final diagnoses:  Allergic reaction, initial encounter  Anaphylaxis, initial encounter    New Prescriptions Discharge Medication List as of 10/21/2016  3:13 PM    START taking these medications   Details  !! diphenhydrAMINE (BENADRYL) 25 mg capsule Take 1 capsule (25 mg total) by mouth every 6 (six) hours as needed., Starting Sat 10/21/2016, Until Thu 10/26/2016, Print    EPINEPHrine 0.3 mg/0.3 mL IJ SOAJ injection Inject 0.3 mLs (0.3 mg total) into the muscle once., Starting Sat 10/21/2016, Print    famotidine (PEPCID) 20 MG tablet Take 1 tablet (20 mg total) by mouth 2 (two) times daily., Starting Sat 10/21/2016, Until Thu 10/26/2016, Print    predniSONE (DELTASONE) 50 MG tablet Take 1 tablet (50 mg total) by mouth daily., Starting Sat 10/21/2016, Until Thu 10/26/2016, Print     !! - Potential duplicate medications found. Please discuss with provider.     Clinical Impression: 1. Allergic reaction, initial encounter   2. Anaphylaxis, initial encounter     Disposition: Discharge  Condition: Good  I have discussed the results, Dx and Tx plan with the pt(& family if present). He/she/they expressed understanding and agree(s) with the plan. Discharge instructions discussed at great length. Strict return precautions discussed and pt &/or family have verbalized understanding of the instructions. No further questions at time of discharge.    Discharge Medication List as of 10/21/2016  3:13 PM    START taking these medications   Details  !! diphenhydrAMINE (BENADRYL) 25 mg capsule Take 1 capsule (25 mg total) by mouth every 6 (six) hours as needed., Starting Sat 10/21/2016, Until Thu 10/26/2016, Print    EPINEPHrine 0.3 mg/0.3 mL IJ SOAJ injection Inject 0.3 mLs (0.3 mg total) into the muscle once., Starting Sat 10/21/2016, Print    famotidine (PEPCID) 20 MG tablet Take 1 tablet (20 mg total) by mouth 2 (two) times daily., Starting Sat 10/21/2016, Until  Thu 10/26/2016, Print    predniSONE (DELTASONE) 50 MG tablet Take 1 tablet (50 mg total) by mouth daily., Starting Sat 10/21/2016, Until Thu 10/26/2016, Print     !! - Potential duplicate medications found. Please discuss with provider.      Follow Up: [facility] [address] [place] [address] [phone]    [facility] EMERGENCY DEPARTMENT [address] [id number] [place] [address] [phone]  If symptoms worsen     Heide Scales, MD 10/21/16 2211

## 2016-10-21 NOTE — ED Triage Notes (Signed)
Pt reports itching all over and hives while at school today.  Tonight she started having significant swelling to her face.  No known cause.  Taking Benadryl without relief.  Denies sob.  Speaking in complete sentences.

## 2016-10-21 NOTE — Discharge Instructions (Signed)
Please fill your prescription for an EpiPen to use if you have another reaction. Please take the steroids, Benadryl, and Pepcid to help your allergic reaction. Please follow-up with a PCP for further management of your reaction. If any symptoms change or worsen, please return to the nearest emergency department.

## 2016-10-23 ENCOUNTER — Emergency Department (HOSPITAL_COMMUNITY)
Admission: EM | Admit: 2016-10-23 | Discharge: 2016-10-24 | Disposition: A | Discharge status: Home / Self Care | Payer: Medicaid Other | Attending: Emergency Medicine | Admitting: Emergency Medicine

## 2016-10-23 ENCOUNTER — Encounter (HOSPITAL_COMMUNITY): Payer: Self-pay | Admitting: *Deleted

## 2016-10-23 DIAGNOSIS — R21 Rash and other nonspecific skin eruption: Secondary | ICD-10-CM

## 2016-10-23 DIAGNOSIS — L509 Urticaria, unspecified: Secondary | ICD-10-CM

## 2016-10-23 DIAGNOSIS — T7840XA Allergy, unspecified, initial encounter: Secondary | ICD-10-CM | POA: Diagnosis present

## 2016-10-23 DIAGNOSIS — L500 Allergic urticaria: Secondary | ICD-10-CM | POA: Insufficient documentation

## 2016-10-23 MED ORDER — HYDROXYZINE HCL 25 MG PO TABS
25.0000 mg | ORAL_TABLET | Freq: Once | ORAL | Status: AC
  Administered 2016-10-23: 25 mg via ORAL
  Filled 2016-10-23: qty 1

## 2016-10-23 MED ORDER — FAMOTIDINE IN NACL 20-0.9 MG/50ML-% IV SOLN
20.0000 mg | Freq: Once | INTRAVENOUS | Status: AC
  Administered 2016-10-23: 20 mg via INTRAVENOUS
  Filled 2016-10-23: qty 50

## 2016-10-23 MED ORDER — DEXAMETHASONE SODIUM PHOSPHATE 10 MG/ML IJ SOLN
10.0000 mg | Freq: Once | INTRAMUSCULAR | Status: AC
Start: 2016-10-23 — End: 2016-10-23
  Administered 2016-10-23: 10 mg via INTRAVENOUS
  Filled 2016-10-23: qty 1

## 2016-10-23 MED ORDER — SODIUM CHLORIDE 0.9 % IV BOLUS (SEPSIS)
1000.0000 mL | Freq: Once | INTRAVENOUS | Status: AC
  Administered 2016-10-23: 1000 mL via INTRAVENOUS

## 2016-10-23 MED ORDER — PROMETHAZINE HCL 25 MG/ML IJ SOLN
25.0000 mg | Freq: Once | INTRAMUSCULAR | Status: AC
  Administered 2016-10-23: 25 mg via INTRAVENOUS
  Filled 2016-10-23: qty 1

## 2016-10-23 NOTE — ED Triage Notes (Addendum)
Pt states that she was here Friday for an allergic reaction. Pt states that at that time she had facial swelling. Pt day pt states that she has had continued irritation with eyes watering. Pt noted to have generalized redness and rash with itching. Pt was instructed to follow up with a specialist but has been unable to do so.

## 2016-10-24 MED ORDER — PROMETHAZINE HCL 25 MG PO TABS
25.0000 mg | ORAL_TABLET | Freq: Four times a day (QID) | ORAL | 0 refills | Status: DC | PRN
Start: 2016-10-24 — End: 2017-09-09

## 2016-10-24 MED ORDER — PREDNISONE 50 MG PO TABS: 50.0000 mg | ORAL_TABLET | Freq: Every day | ORAL | 0 refills | Status: AC

## 2016-10-24 MED ORDER — HYDROXYZINE HCL 25 MG PO TABS: 25.0000 mg | ORAL_TABLET | Freq: Four times a day (QID) | ORAL | 0 refills | Status: DC

## 2016-10-24 NOTE — ED Provider Notes (Signed)
[facility] Provider Note   CSN: [id number] Arrival date & time: 10/23/16  1834     History   Chief Complaint Chief Complaint  Patient presents with  . Allergic Reaction    HPI Jessica Owen is a 31 y.o. female.  HPI Patient presents to the emergency department with continued rash to arms and legs.  The patient states her legs are itching very badly along with her arms.  The patient states that she was here Friday night into Saturday morning with an allergic reaction.  The patient states that she did start using a new laundry detergent last week.  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  Patient states that she was given medicines for home have not seemed to have helped.  Patient states that nothing seems to make her condition better or worse. The patient denies chest pain, shortness of breath, headache,blurred vision, neck pain, fever, cough, weakness, numbness, dizziness, anorexia, edema, abdominal pain, nausea, vomiting, diarrhea,  back pain, dysuria, hematemesis, bloody stool, near syncope, or syncope.  The patient states that she does not have facial swelling like she did the other night History reviewed. No pertinent past medical history.  There are no active problems to display for this patient.   Past Surgical History:  Procedure Laterality Date  . CESAREAN SECTION    . TUBAL LIGATION      OB History    No data available       Home Medications    Prior to Admission medications   Medication Sig Start Date End Date Taking? Authorizing Provider  diphenhydrAMINE (BENADRYL) 25 mg capsule Take 1 capsule (25 mg total) by mouth every 6 (six) hours as needed. 10/21/16 10/26/16 Yes Canary Brim Tegeler, MD  famotidine (PEPCID) 20 MG tablet Take 1 tablet (20 mg total) by mouth 2 (two) times daily. 10/21/16 10/26/16 Yes Canary Brim Tegeler, MD  predniSONE (DELTASONE) 50 MG tablet Take 1 tablet (50 mg total) by mouth daily.  10/21/16 10/26/16 Yes Canary Brim Tegeler, MD  acetaminophen-codeine (TYLENOL #3) 300-30 MG tablet Take 1-2 tablets by mouth every 6 (six) hours as needed for moderate pain or severe pain. Patient not taking: Reported on 08/03/2015 07/10/15   Fayrene Helper, PA-C  clindamycin (CLEOCIN) 150 MG capsule Take 1 capsule (150 mg total) by mouth every 6 (six) hours. Patient not taking: Reported on 08/03/2015 07/14/15   Shon Baton, MD  hydrocortisone ointment 0.5 % Apply 1 application topically 2 (two) times daily. Patient not taking: Reported on 10/21/2016 12/13/15   Arthor Captain, PA-C  ibuprofen (ADVIL,MOTRIN) 800 MG tablet Take 1 tablet (800 mg total) by mouth 3 (three) times daily. Patient not taking: Reported on 08/03/2015 07/14/15   Shon Baton, MD  naproxen (NAPROSYN) 500 MG tablet Take 1 tablet (500 mg total) by mouth 2 (two) times daily. Patient not taking: Reported on 08/03/2015 05/20/15   Cheri Fowler, PA-C    Family History No family history on file.  Social History Social History  Substance Use Topics  . Smoking status: Never Smoker  . Smokeless tobacco: Never Used  . Alcohol use No     Allergies   Patient has no known allergies.   Review of Systems Review of Systems  All other systems negative except as documented in the HPI. All pertinent positives and negatives as reviewed in the HPI. Physical Exam Updated Vital Signs BP 109/64   Pulse 81   Temp 98.4 F (36.9 C) (  Oral)   Resp 16   LMP 09/30/2016   SpO2 99%   Physical Exam  Constitutional: She is oriented to person, place, and time. She appears well-developed and well-nourished. No distress.  HENT:  Head: Normocephalic and atraumatic.  Mouth/Throat: Oropharynx is clear and moist.  Eyes: Pupils are equal, round, and reactive to light.  Neck: Normal range of motion. Neck supple.  Cardiovascular: Normal rate, regular rhythm and normal heart sounds.  Exam reveals no gallop and no friction rub.   No murmur  heard. Pulmonary/Chest: Effort normal and breath sounds normal. No respiratory distress. She has no wheezes.  Neurological: She is alert and oriented to person, place, and time. She exhibits normal muscle tone. Coordination normal.  Skin: Skin is warm and dry. Capillary refill takes less than 2 seconds. Rash noted. No erythema.     Psychiatric: She has a normal mood and affect. Her behavior is normal.  Nursing note and vitals reviewed.    ED Treatments / Results  Labs (all labs ordered are listed, but only abnormal results are displayed) Labs Reviewed - No data to display  EKG  EKG Interpretation None       Radiology No results found.  Procedures Procedures (including critical care time)  Medications Ordered in ED Medications  dexamethasone (DECADRON) injection 10 mg (10 mg Intravenous Given 10/23/16 2250)  promethazine (PHENERGAN) injection 25 mg (25 mg Intravenous Given 10/23/16 2250)  hydrOXYzine (ATARAX/VISTARIL) tablet 25 mg (25 mg Oral Given 10/23/16 2250)  sodium chloride 0.9 % bolus 1,000 mL (0 mLs Intravenous Stopped 10/24/16 0146)  famotidine (PEPCID) IVPB 20 mg premix (0 mg Intravenous Stopped 10/24/16 0053)     Initial Impression / Assessment and Plan / ED Course  I have reviewed the triage vital signs and the nursing notes.  Pertinent labs & imaging results that were available during my care of the patient were reviewed by me and considered in my medical decision making (see chart for details).    Patient is feeling better following IV medications.  I did give her some Phenergan for the hives, which seemed to have improved her symptoms.  The hives seem to have dissipated somewhat.  Patient is been resting comfortably in the room.  She has been monitored over many hours   Final Clinical Impressions(s) / ED Diagnoses   Final diagnoses:  None    New Prescriptions New Prescriptions   No medications on file     Charlestine Night, PA-C 10/24/16 0153     Zadie Rhine, MD 10/24/16 [phone]

## 2016-10-24 NOTE — ED Notes (Signed)
Pt stable, ambulatory, states understanding of discharge instructions 

## 2016-10-24 NOTE — Discharge Instructions (Signed)
Return here as needed. Follow up with a primary doctor. °

## 2017-01-20 ENCOUNTER — Emergency Department (HOSPITAL_COMMUNITY)
Admission: EM | Admit: 2017-01-20 | Discharge: 2017-01-20 | Disposition: A | Discharge status: Home / Self Care | Payer: Medicaid Other | Attending: Emergency Medicine | Admitting: Emergency Medicine

## 2017-01-20 ENCOUNTER — Encounter (HOSPITAL_COMMUNITY): Payer: Self-pay | Admitting: Emergency Medicine

## 2017-01-20 DIAGNOSIS — L089 Local infection of the skin and subcutaneous tissue, unspecified: Secondary | ICD-10-CM | POA: Diagnosis not present

## 2017-01-20 DIAGNOSIS — X58XXXA Exposure to other specified factors, initial encounter: Secondary | ICD-10-CM | POA: Insufficient documentation

## 2017-01-20 DIAGNOSIS — Y999 Unspecified external cause status: Secondary | ICD-10-CM | POA: Diagnosis not present

## 2017-01-20 DIAGNOSIS — Y929 Unspecified place or not applicable: Secondary | ICD-10-CM | POA: Insufficient documentation

## 2017-01-20 DIAGNOSIS — Y939 Activity, unspecified: Secondary | ICD-10-CM | POA: Diagnosis not present

## 2017-01-20 DIAGNOSIS — K130 Diseases of lips: Secondary | ICD-10-CM

## 2017-01-20 DIAGNOSIS — S01541A Puncture wound with foreign body of lip, initial encounter: Secondary | ICD-10-CM | POA: Diagnosis present

## 2017-01-20 MED ORDER — CEPHALEXIN 500 MG PO CAPS: 500.0000 mg | ORAL_CAPSULE | Freq: Three times a day (TID) | ORAL | 0 refills | Status: DC

## 2017-01-20 NOTE — ED Triage Notes (Signed)
Pt here for possible infection of her lip piercing placed 5 months ago. Pt reports intermittently for past 2 weeks seeing pus from area.

## 2017-01-20 NOTE — Discharge Instructions (Signed)
Your piercing is likely infected.  Please take antibiotic as prescribed.  If no improvement in 2 days, please return for further evaluation.

## 2017-01-20 NOTE — ED Notes (Addendum)
Upper lip piercing, about 4 months old, started swelling and " had pus draining" the last few days. No trauma reported.

## 2017-01-20 NOTE — ED Provider Notes (Signed)
[facility]PT Provider Note   CSN: [id number] Arrival date & time: 01/20/17  1212     History   Chief Complaint Chief Complaint  Patient presents with  . Wound Check    lip piercing    HPI Jessica Owen is a 31 y.o. female.  HPI   31 year old female presents complaining of lip pain. Patient had a piercing to the mid upper lip approximately 5 months ago. For the past week she has noticed mild discomfort at the piercing site with some drainage of possible pus in that area. She has been cleaning with alcohol, rinse mouth with water several times daily with some improvement. She denies associated fever, dental pain, or rash. Unable to recall last tetanus status. Denies any associated itching.   History reviewed. No pertinent past medical history.  There are no active problems to display for this patient.   Past Surgical History:  Procedure Laterality Date  . CESAREAN SECTION    . TUBAL LIGATION      OB History    No data available       Home Medications    Prior to Admission medications   Medication Sig Start Date End Date Taking? Authorizing Provider  acetaminophen-codeine (TYLENOL #3) 300-30 MG tablet Take 1-2 tablets by mouth every 6 (six) hours as needed for moderate pain or severe pain. Patient not taking: Reported on 08/03/2015 07/10/15   Fayrene Helper, PA-C  clindamycin (CLEOCIN) 150 MG capsule Take 1 capsule (150 mg total) by mouth every 6 (six) hours. Patient not taking: Reported on 08/03/2015 07/14/15   Horton, Mayer Masker, MD  diphenhydrAMINE (BENADRYL) 25 mg capsule Take 1 capsule (25 mg total) by mouth every 6 (six) hours as needed. 10/21/16 10/26/16  Tegeler, Canary Brim, MD  famotidine (PEPCID) 20 MG tablet Take 1 tablet (20 mg total) by mouth 2 (two) times daily. 10/21/16 10/26/16  Tegeler, Canary Brim, MD  hydrocortisone ointment 0.5 % Apply 1 application topically 2 (two) times daily. Patient not taking: Reported on 10/21/2016 12/13/15   Arthor Captain, PA-C  hydrOXYzine (ATARAX/VISTARIL) 25 MG tablet Take 1 tablet (25 mg total) by mouth every 6 (six) hours. 10/24/16   Lawyer, Cristal Deer, PA-C  ibuprofen (ADVIL,MOTRIN) 800 MG tablet Take 1 tablet (800 mg total) by mouth 3 (three) times daily. Patient not taking: Reported on 08/03/2015 07/14/15   Horton, Mayer Masker, MD  naproxen (NAPROSYN) 500 MG tablet Take 1 tablet (500 mg total) by mouth 2 (two) times daily. Patient not taking: Reported on 08/03/2015 05/20/15   Cheri Fowler, PA-C  promethazine (PHENERGAN) 25 MG tablet Take 1 tablet (25 mg total) by mouth every 6 (six) hours as needed. 10/24/16   Charlestine Night, PA-C    Family History No family history on file.  Social History Social History  Substance Use Topics  . Smoking status: Never Smoker  . Smokeless tobacco: Never Used  . Alcohol use No     Allergies   Patient has no known allergies.   Review of Systems Review of Systems  Constitutional: Negative for fever.  Skin: Negative for rash.     Physical Exam Updated Vital Signs BP 117/86   Pulse 75   Temp 98.8 F (37.1 C)   Resp 18   Ht 5\' 10"  (1.778 m)   Wt 104.3 kg (230 lb)   LMP 01/06/2017   SpO2 99%   BMI 33.00 kg/m   Physical Exam  Constitutional: She appears well-developed and well-nourished. No distress.  Obese female in  no acute discomfort.  HENT:  Head: Atraumatic.  Small lip piercing noted at the philtrum of upper lip.  Minimal tenderness to palpation with small discharge noted.  No induration or fluctuance, no erythema.  No swelling  Eyes: Conjunctivae are normal.  Neck: Neck supple.  Neurological: She is alert.  Skin: No rash noted.  Psychiatric: She has a normal mood and affect.  Nursing note and vitals reviewed.    ED Treatments / Results  Labs (all labs ordered are listed, but only abnormal results are displayed) Labs Reviewed - No data to display  EKG  EKG Interpretation None       Radiology No results  found.  Procedures Procedures (including critical care time)  Medications Ordered in ED Medications - No data to display   Initial Impression / Assessment and Plan / ED Course  I have reviewed the triage vital signs and the nursing notes.  Pertinent labs & imaging results that were available during my care of the patient were reviewed by me and considered in my medical decision making (see chart for details).     BP 117/86   Pulse 75   Temp 98.8 F (37.1 C)   Resp 18   Ht 5\' 10"  (1.778 m)   Wt 104.3 kg (230 lb)   LMP 01/06/2017   SpO2 99%   BMI 33.00 kg/m    Final Clinical Impressions(s) / ED Diagnoses   Final diagnoses:  Pierced lip infection    New Prescriptions New Prescriptions   CEPHALEXIN (KEFLEX) 500 MG CAPSULE    Take 1 capsule (500 mg total) by mouth 3 (three) times daily. 2 caps po bid x 7 days   12:36 PM Pt with irritation and discharge coming from upper lip piercing at the philtrum. No obvious abscess amenable for drainage.  We did discuss option of removing her piercing.  Pt prefers to keep it.  Therefore, plan to prescribe abx.  Recheck in 48 hrs.  Pt agrees with plan.     Fayrene Helperran, Vernis [place]ac[place], PA-C 01/20/17 1243    Arby BarrettePfeiffer, Marcy, MD 01/20/17 [phone]

## 2017-02-12 ENCOUNTER — Ambulatory Visit: Payer: PRIVATE HEALTH INSURANCE | Admitting: Obstetrics and Gynecology

## 2017-07-18 ENCOUNTER — Encounter (HOSPITAL_COMMUNITY): Payer: Self-pay

## 2017-07-18 ENCOUNTER — Emergency Department (HOSPITAL_COMMUNITY)
Admission: EM | Admit: 2017-07-18 | Discharge: 2017-07-19 | Disposition: A | Discharge status: Home / Self Care | Payer: Medicaid Other | Attending: Emergency Medicine | Admitting: Emergency Medicine

## 2017-07-18 DIAGNOSIS — N760 Acute vaginitis: Secondary | ICD-10-CM | POA: Insufficient documentation

## 2017-07-18 DIAGNOSIS — B9689 Other specified bacterial agents as the cause of diseases classified elsewhere: Secondary | ICD-10-CM

## 2017-07-18 DIAGNOSIS — F17200 Nicotine dependence, unspecified, uncomplicated: Secondary | ICD-10-CM | POA: Diagnosis not present

## 2017-07-18 DIAGNOSIS — Z79899 Other long term (current) drug therapy: Secondary | ICD-10-CM | POA: Diagnosis not present

## 2017-07-18 DIAGNOSIS — N898 Other specified noninflammatory disorders of vagina: Secondary | ICD-10-CM

## 2017-07-18 DIAGNOSIS — R102 Pelvic and perineal pain: Secondary | ICD-10-CM | POA: Diagnosis present

## 2017-07-18 LAB — COMPREHENSIVE METABOLIC PANEL
ALT: 13 U/L — ABNORMAL LOW (ref 14–54)
ANION GAP: 9 (ref 5–15)
AST: 17 U/L (ref 15–41)
Albumin: 3.7 g/dL (ref 3.5–5.0)
Alkaline Phosphatase: 59 U/L (ref 38–126)
BILIRUBIN TOTAL: 0.6 mg/dL (ref 0.3–1.2)
BUN: 6 mg/dL (ref 6–20)
CALCIUM: 8.8 mg/dL — AB (ref 8.9–10.3)
CO2: 23 mmol/L (ref 22–32)
Chloride: 106 mmol/L (ref 101–111)
Creatinine, Ser: 0.74 mg/dL (ref 0.44–1.00)
GFR calc non Af Amer: >60 mL/min (ref >60)
Glucose, Bld: 95 mg/dL (ref 65–99)
POTASSIUM: 4.2 mmol/L (ref 3.5–5.1)
SODIUM: 138 mmol/L (ref 135–145)
TOTAL PROTEIN: 7.3 g/dL (ref 6.5–8.1)

## 2017-07-18 LAB — CBC
HCT: 36.9 % (ref 36.0–46.0)
HEMOGLOBIN: 10.7 g/dL — AB (ref 12.0–15.0)
MCH: 22.7 pg — ABNORMAL LOW (ref 26.0–34.0)
MCHC: 29 g/dL — ABNORMAL LOW (ref 30.0–36.0)
MCV: 78.2 fL (ref 78.0–100.0)
Platelets: 333 10*3/uL (ref 150–400)
RBC: 4.72 MIL/uL (ref 3.87–5.11)
RDW: 15.1 % (ref 11.5–15.5)
WBC: 5.2 10*3/uL (ref 4.0–10.5)

## 2017-07-18 LAB — I-STAT BETA HCG BLOOD, ED (MC, WL, AP ONLY)

## 2017-07-18 LAB — LIPASE, BLOOD: Lipase: 26 U/L (ref 11–51)

## 2017-07-18 NOTE — ED Provider Notes (Signed)
[facility] EMERGENCY DEPARTMENT Provider Note   CSN: [id number] Arrival date & time: 07/18/17  1701     History   Chief Complaint Chief Complaint  Patient presents with  . Abdominal Pain    HPI Jessica Owen is a 32 y.o. female.  Patient presents to the ED with a chief complaint of pelvic pain.  She states that she has had new associated pelvic discharge.  She denies fever, chills, nausea, vomiting, or diarrhea.  She denies any dysuria.  She states that she would like to be checked for STDs.  There are no aggravating or alleviating factors.  She has not taken anything for her symptoms.   The history is provided by the patient. No language interpreter was used.    History reviewed. No pertinent past medical history.  There are no active problems to display for this patient.   Past Surgical History:  Procedure Laterality Date  . CESAREAN SECTION    . TUBAL LIGATION      OB History    No data available       Home Medications    Prior to Admission medications   Medication Sig Start Date End Date Taking? Authorizing Provider  acetaminophen-codeine (TYLENOL #3) 300-30 MG tablet Take 1-2 tablets by mouth every 6 (six) hours as needed for moderate pain or severe pain. Patient not taking: Reported on 08/03/2015 07/10/15   Fayrene Helper, PA-C  cephALEXin (KEFLEX) 500 MG capsule Take 1 capsule (500 mg total) by mouth 3 (three) times daily. 2 caps po bid x 7 days 01/20/17   Fayrene Helper, PA-C  clindamycin (CLEOCIN) 150 MG capsule Take 1 capsule (150 mg total) by mouth every 6 (six) hours. Patient not taking: Reported on 08/03/2015 07/14/15   Horton, Mayer Masker, MD  diphenhydrAMINE (BENADRYL) 25 mg capsule Take 1 capsule (25 mg total) by mouth every 6 (six) hours as needed. 10/21/16 10/26/16  Tegeler, Canary Brim, MD  famotidine (PEPCID) 20 MG tablet Take 1 tablet (20 mg total) by mouth 2 (two) times daily. 10/21/16 10/26/16  Tegeler, Canary Brim, MD    hydrocortisone ointment 0.5 % Apply 1 application topically 2 (two) times daily. Patient not taking: Reported on 10/21/2016 12/13/15   Arthor Captain, PA-C  hydrOXYzine (ATARAX/VISTARIL) 25 MG tablet Take 1 tablet (25 mg total) by mouth every 6 (six) hours. 10/24/16   Lawyer, Cristal Deer, PA-C  ibuprofen (ADVIL,MOTRIN) 800 MG tablet Take 1 tablet (800 mg total) by mouth 3 (three) times daily. Patient not taking: Reported on 08/03/2015 07/14/15   Horton, Mayer Masker, MD  naproxen (NAPROSYN) 500 MG tablet Take 1 tablet (500 mg total) by mouth 2 (two) times daily. Patient not taking: Reported on 08/03/2015 05/20/15   Cheri Fowler, PA-C  promethazine (PHENERGAN) 25 MG tablet Take 1 tablet (25 mg total) by mouth every 6 (six) hours as needed. 10/24/16   Charlestine Night, PA-C    Family History No family history on file.  Social History Social History   Tobacco Use  . Smoking status: Current Some Day Smoker  . Smokeless tobacco: Never Used  Substance Use Topics  . Alcohol use: No  . Drug use: No     Allergies   Patient has no known allergies.   Review of Systems Review of Systems  All other systems reviewed and are negative.    Physical Exam Updated Vital Signs BP (!) 138/91 (BP Location: Right Arm)   Pulse 75   Temp 98.5 F (36.9 C)  Resp 15   Ht 5\' 9"  (1.753 m)   Wt 104.3 kg (230 lb)   SpO2 100%   BMI 33.97 kg/m   Physical Exam  Constitutional: She is oriented to person, place, and time. She appears well-developed and well-nourished.  HENT:  Head: Normocephalic and atraumatic.  Eyes: Conjunctivae and EOM are normal. Pupils are equal, round, and reactive to light.  Neck: Normal range of motion. Neck supple.  Cardiovascular: Normal rate and regular rhythm. Exam reveals no gallop and no friction rub.  No murmur heard. Pulmonary/Chest: Effort normal and breath sounds normal. No respiratory distress. She has no wheezes. She has no rales. She exhibits no tenderness.   Abdominal: Soft. Bowel sounds are normal. She exhibits no distension and no mass. There is no tenderness. There is no rebound and no guarding.  Genitourinary:  Genitourinary Comments: Pelvic exam chaperoned by female ER tech, no right or left adnexal tenderness, mild uterine tenderness, moderate vaginal discharge, no bleeding, no CMT or friability, no foreign body, no injury to the external genitalia, no other significant findings   Musculoskeletal: Normal range of motion. She exhibits no edema or tenderness.  Neurological: She is alert and oriented to person, place, and time.  Skin: Skin is warm and dry.  Psychiatric: She has a normal mood and affect. Her behavior is normal. Judgment and thought content normal.  Nursing note and vitals reviewed.    ED Treatments / Results  Labs (all labs ordered are listed, but only abnormal results are displayed) Labs Reviewed  COMPREHENSIVE METABOLIC PANEL - Abnormal; Notable for the following components:      Result Value   Calcium 8.8 (*)    ALT 13 (*)    All other components within normal limits  CBC - Abnormal; Notable for the following components:   Hemoglobin 10.7 (*)    MCH 22.7 (*)    MCHC 29.0 (*)    All other components within normal limits  WET PREP, GENITAL  LIPASE, BLOOD  URINALYSIS, ROUTINE W REFLEX MICROSCOPIC  I-STAT BETA HCG BLOOD, ED ([facility], [facility], [facility] ONLY)  GC/CHLAMYDIA PROBE AMP ([place]) NOT [facility]    EKG  EKG Interpretation None       Radiology No results found.  Procedures Procedures (including critical care time)  Medications Ordered in ED Medications - No data to display   Initial Impression / Assessment and Plan / ED Course  I have reviewed the triage vital signs and the nursing notes.  Pertinent labs & imaging results that were available during my care of the patient were reviewed by me and considered in my medical decision making (see chart for details).     Patient with vaginal discharge which  is new for her.  Mild pelvic pain.  No adnexal pain on bimanual. Doubt torsion. Not pregnant. Clue cells on wet prep.  Some concern for std.  Will treat with rocephin, azithro and flagyl.      Final Clinical Impressions(s) / ED Diagnoses   Final diagnoses:  BV (bacterial vaginosis)  Vaginal discharge    ED Discharge Orders    None       Roxy HorsemanBrowning, Therman Hughlett, PA-C 07/19/17 0102    Geoffery Lyonselo, Douglas, MD 07/19/17 [phone]

## 2017-07-18 NOTE — ED Triage Notes (Signed)
Patient complains of lower intermittent abdominal pain x 3 weeks. Denies any associated symptoms. No dysuria, complains of pressure

## 2017-07-19 ENCOUNTER — Other Ambulatory Visit: Payer: Self-pay

## 2017-07-19 LAB — URINALYSIS, ROUTINE W REFLEX MICROSCOPIC
BILIRUBIN URINE: NEGATIVE
Glucose, UA: NEGATIVE mg/dL
HGB URINE DIPSTICK: NEGATIVE
KETONES UR: NEGATIVE mg/dL
Leukocytes, UA: NEGATIVE
NITRITE: NEGATIVE
Protein, ur: NEGATIVE mg/dL
SPECIFIC GRAVITY, URINE: 1.02 (ref 1.005–1.030)
pH: 6 (ref 5.0–8.0)

## 2017-07-19 LAB — WET PREP, GENITAL
SPERM: NONE SEEN
Trich, Wet Prep: NONE SEEN
WBC, Wet Prep HPF POC: NONE SEEN
Yeast Wet Prep HPF POC: NONE SEEN

## 2017-07-19 LAB — GC/CHLAMYDIA PROBE AMP (~~LOC~~) NOT AT ARMC
Chlamydia: NEGATIVE
Neisseria Gonorrhea: NEGATIVE

## 2017-07-19 MED ORDER — CEFTRIAXONE SODIUM 250 MG IJ SOLR
250.0000 mg | Freq: Once | INTRAMUSCULAR | Status: AC
  Administered 2017-07-19: 250 mg via INTRAMUSCULAR
  Filled 2017-07-19: qty 250

## 2017-07-19 MED ORDER — METRONIDAZOLE 500 MG PO TABS: 500.0000 mg | ORAL_TABLET | Freq: Two times a day (BID) | ORAL | 0 refills | Status: DC

## 2017-07-19 MED ORDER — AZITHROMYCIN 250 MG PO TABS
1000.0000 mg | ORAL_TABLET | Freq: Once | ORAL | Status: AC
  Administered 2017-07-19: 1000 mg via ORAL
  Filled 2017-07-19: qty 4

## 2017-07-19 MED ORDER — LIDOCAINE HCL (PF) 1 % IJ SOLN
INTRAMUSCULAR | Status: AC
  Administered 2017-07-19: 0.9 mL
  Filled 2017-07-19: qty 5

## 2017-09-09 ENCOUNTER — Other Ambulatory Visit: Payer: Self-pay

## 2017-09-09 ENCOUNTER — Ambulatory Visit (INDEPENDENT_AMBULATORY_CARE_PROVIDER_SITE_OTHER): Payer: Medicaid Other

## 2017-09-09 ENCOUNTER — Encounter (HOSPITAL_COMMUNITY): Payer: Self-pay | Admitting: *Deleted

## 2017-09-09 ENCOUNTER — Ambulatory Visit (HOSPITAL_COMMUNITY)
Admission: EM | Admit: 2017-09-09 | Discharge: 2017-09-09 | Disposition: A | Discharge status: Home / Self Care | Payer: Medicaid Other | Attending: Internal Medicine | Admitting: Internal Medicine

## 2017-09-09 DIAGNOSIS — W2203XA Walked into furniture, initial encounter: Secondary | ICD-10-CM | POA: Diagnosis not present

## 2017-09-09 DIAGNOSIS — S92412A Displaced fracture of proximal phalanx of left great toe, initial encounter for closed fracture: Secondary | ICD-10-CM | POA: Diagnosis not present

## 2017-09-09 MED ORDER — MELOXICAM 7.5 MG PO TABS: 7.5000 mg | ORAL_TABLET | Freq: Every day | ORAL | 0 refills | Status: DC

## 2017-09-09 NOTE — ED Triage Notes (Signed)
Reports "horseplaying" 5 days ago; believes she hit left great toe on a door.  Has tried buddy taping, but pain continues to be significant and toe "doesn't feel right".  C/O some numbness to left great toe; swelling noted.

## 2017-09-09 NOTE — ED Provider Notes (Signed)
[facility]    CSN: [id number] Arrival date & time: 09/09/17  1201     History   Chief Complaint Chief Complaint  Patient presents with  . Toe Injury    HPI Jessica Owen is a 32 y.o. female.   32 year old female comes in for for 5-day history of left great toe pain after injury.  States she was "horse playing" when she jammed her toe into furniture.  She tried buddy taping, ice compress and elevation but continues to have toe swelling.  States the toe "does not feel right".  She has not taken any medications for the pain.  States has had intermittent numbness and tingling of the left great toe.       History reviewed. No pertinent past medical history.  There are no active problems to display for this patient.   Past Surgical History:  Procedure Laterality Date  . CESAREAN SECTION    . TUBAL LIGATION      OB History    No data available       Home Medications    Prior to Admission medications   Medication Sig Start Date End Date Taking? Authorizing Provider  diphenhydrAMINE (BENADRYL) 25 mg capsule Take 1 capsule (25 mg total) by mouth every 6 (six) hours as needed. 10/21/16 10/26/16  Tegeler, Canary Brim, MD  famotidine (PEPCID) 20 MG tablet Take 1 tablet (20 mg total) by mouth 2 (two) times daily. 10/21/16 10/26/16  Tegeler, Canary Brim, MD  meloxicam (MOBIC) 7.5 MG tablet Take 1 tablet (7.5 mg total) by mouth daily. 09/09/17   Belinda Fisher, PA-C    Family History Family History  Problem Relation Age of Onset  . Hypertension Father     Social History Social History   Tobacco Use  . Smoking status: Current Some Day Smoker  . Smokeless tobacco: Never Used  Substance Use Topics  . Alcohol use: No  . Drug use: No     Allergies   Patient has no known allergies.   Review of Systems Review of Systems  Reason unable to perform ROS: See HPI as above.     Physical Exam Triage Vital Signs ED Triage Vitals  Enc Vitals Group     BP  09/09/17 1223 126/89     Pulse Rate 09/09/17 1223 81     Resp 09/09/17 1223 16     Temp 09/09/17 1223 (!) 97.5 F (36.4 C)     Temp src --      SpO2 09/09/17 1223 97 %     Weight --      Height --      Head Circumference --      Peak Flow --      Pain Score 09/09/17 1224 5     Pain Loc --      Pain Edu? --      Excl. in GC? --    No data found.  Updated Vital Signs BP 126/89   Pulse 81   Temp (!) 97.5 F (36.4 C)   Resp 16   LMP 08/19/2017 (Approximate)   SpO2 97%   Physical Exam  Constitutional: She is oriented to person, place, and time. She appears well-developed and well-nourished. No distress.  HENT:  Head: Normocephalic and atraumatic.  Eyes: Conjunctivae are normal. Pupils are equal, round, and reactive to light.  Musculoskeletal:  Diffuse swelling of the left great toe.  No contusion, erythema, increased warmth.  Tenderness to palpation of the distal first  and second MTP. Diffuse tenderness to palpation of left great toe.  Decreased range of motion.  Sensation intact. Pedal pulse 2+ and equal bilaterally.  Cap refill less than 2 seconds.  Neurological: She is alert and oriented to person, place, and time.    UC Treatments / Results  Labs (all labs ordered are listed, but only abnormal results are displayed) Labs Reviewed - No data to display  EKG  EKG Interpretation None       Radiology Dg Toe Great Left  Result Date: 09/09/2017 CLINICAL DATA:  Injury EXAM: LEFT GREAT TOE COMPARISON:  None. FINDINGS: Displaced/comminuted fracture involving the mid and distal portions of the first proximal phalanx. Fracture line extends to the distal articular surface. Alignment at the first MTP and IP joints remain normal. IMPRESSION: Displaced/comminuted fracture of the first proximal phalanx, as detailed above. Electronically Signed   By: Bary RichardStan  Maynard M.D.   On: 09/09/2017 13:00    Procedures Procedures (including critical care time)  Medications Ordered in  UC Medications - No data to display   Initial Impression / Assessment and Plan / UC Course  I have reviewed the triage vital signs and the nursing notes.  Pertinent labs & imaging results that were available during my care of the patient were reviewed by me and considered in my medical decision making (see chart for details).    Discussed x-ray results with patient.  Mobic as directed.  Ice compress, elevation.  Postop boot during activity.  Patient to follow-up with orthopedics for further evaluation and treatment needed.  Final Clinical Impressions(s) / UC Diagnoses   Final diagnoses:  Displaced fracture of proximal phalanx of left great toe, initial encounter for closed fracture    ED Discharge Orders        Ordered    meloxicam (MOBIC) 7.5 MG tablet  Daily     09/09/17 1304        Belinda FisherYu, Lillith Mcneff V, PA-C 09/09/17 1322

## 2017-09-09 NOTE — Discharge Instructions (Signed)
Start Mobic as directed.  Ice compress and elevation to help with swelling.  Postop boot during activity.  Follow-up with orthopedics on Monday for further evaluation and treatment needed.

## 2017-11-05 ENCOUNTER — Ambulatory Visit (HOSPITAL_COMMUNITY)
Admission: EM | Admit: 2017-11-05 | Discharge: 2017-11-05 | Disposition: A | Discharge status: Home / Self Care | Payer: Medicaid Other | Attending: Family Medicine | Admitting: Family Medicine

## 2017-11-05 ENCOUNTER — Encounter (HOSPITAL_COMMUNITY): Payer: Self-pay | Admitting: *Deleted

## 2017-11-05 DIAGNOSIS — Z8249 Family history of ischemic heart disease and other diseases of the circulatory system: Secondary | ICD-10-CM | POA: Diagnosis not present

## 2017-11-05 DIAGNOSIS — F17200 Nicotine dependence, unspecified, uncomplicated: Secondary | ICD-10-CM | POA: Diagnosis not present

## 2017-11-05 DIAGNOSIS — J029 Acute pharyngitis, unspecified: Secondary | ICD-10-CM | POA: Insufficient documentation

## 2017-11-05 HISTORY — DX: Acute pharyngitis, unspecified: J02.9

## 2017-11-05 LAB — POCT RAPID STREP A: STREPTOCOCCUS, GROUP A SCREEN (DIRECT): NEGATIVE

## 2017-11-05 NOTE — ED Provider Notes (Signed)
[facility]    CSN: [id number] Arrival date & time: 11/05/17  1857     History   Chief Complaint Chief Complaint  Patient presents with  . Sore Throat    HPI Jessica Owen is a 32 y.o. female.   Patient complains of sore throat denies fever headache.  She noticed some white material on the left side of her throat and she was looking in the mirror.  HPI  History reviewed. No pertinent past medical history.  There are no active problems to display for this patient.   Past Surgical History:  Procedure Laterality Date  . CESAREAN SECTION    . TUBAL LIGATION      OB History   None      Home Medications    Prior to Admission medications   Medication Sig Start Date End Date Taking? Authorizing Provider  diphenhydrAMINE (BENADRYL) 25 mg capsule Take 1 capsule (25 mg total) by mouth every 6 (six) hours as needed. 10/21/16 10/26/16  Tegeler, Canary Brim, MD  famotidine (PEPCID) 20 MG tablet Take 1 tablet (20 mg total) by mouth 2 (two) times daily. 10/21/16 10/26/16  Tegeler, Canary Brim, MD  meloxicam (MOBIC) 7.5 MG tablet Take 1 tablet (7.5 mg total) by mouth daily. 09/09/17   Belinda Fisher, PA-C    Family History Family History  Problem Relation Age of Onset  . Hypertension Father     Social History Social History   Tobacco Use  . Smoking status: Current Some Day Smoker  . Smokeless tobacco: Never Used  Substance Use Topics  . Alcohol use: No  . Drug use: No     Allergies   Patient has no known allergies.   Review of Systems Review of Systems  Constitutional: Positive for fatigue. Negative for chills and fever.  HENT: Positive for sore throat. Negative for ear pain.   Eyes: Negative for pain and visual disturbance.  Respiratory: Negative for cough and shortness of breath.   Cardiovascular: Negative for chest pain and palpitations.  Gastrointestinal: Negative for abdominal pain and vomiting.  Genitourinary: Negative for dysuria and  hematuria.  Musculoskeletal: Negative for arthralgias and back pain.  Skin: Negative for color change and rash.  Neurological: Negative for seizures and syncope.  All other systems reviewed and are negative.    Physical Exam Triage Vital Signs ED Triage Vitals  Enc Vitals Group     BP 11/05/17 1953 (!) 142/84     Pulse Rate 11/05/17 1953 67     Resp 11/05/17 1953 17     Temp 11/05/17 1953 98 F (36.7 C)     Temp src --      SpO2 11/05/17 1953 100 %     Weight --      Height --      Head Circumference --      Peak Flow --      Pain Score 11/05/17 1951 5     Pain Loc --      Pain Edu? --      Excl. in GC? --    No data found.  Updated Vital Signs BP (!) 142/84 (BP Location: Left Arm)   Pulse 67   Temp 98 F (36.7 C)   Resp 17   SpO2 100%   Visual Acuity Right Eye Distance:   Left Eye Distance:   Bilateral Distance:    Right Eye Near:   Left Eye Near:    Bilateral Near:     Physical  Exam  Constitutional: She appears well-developed and well-nourished.  HENT:  Head: Normocephalic.  Mouth/Throat: Mucous membranes are normal. Oropharyngeal exudate present.  Neck: Normal range of motion.  Cardiovascular: Normal rate and regular rhythm.     UC Treatments / Results  Labs (all labs ordered are listed, but only abnormal results are displayed) Labs Reviewed - No data to display  EKG None  Radiology No results found.  Procedures Procedures (including critical care time)  Medications Ordered in UC Medications - No data to display  Initial Impression / Assessment and Plan / UC Course  I have reviewed the triage vital signs and the nursing notes.  Pertinent labs & imaging results that were available during my care of the patient were reviewed by me and considered in my medical decision making (see chart for details).    Pharyngitis, strep negative.  Symptomatic treatment with gargles and lozenges  Final Clinical Impressions(s) / UC Diagnoses   Final  diagnoses:  None   Discharge Instructions   None    ED Prescriptions    None     Controlled Substance Prescriptions [place] Controlled Substance Registry consulted? No   Frederica Kuster, MD 11/05/17 2025

## 2017-11-05 NOTE — ED Triage Notes (Signed)
Patient reports sore throat since yesterday, reports white pockets on tonsils. Denies fever but just feels bad.

## 2017-11-05 NOTE — ED Notes (Signed)
Pt discharged by provider.

## 2017-11-08 LAB — CULTURE, GROUP A STREP (THRC)

## 2018-02-15 ENCOUNTER — Other Ambulatory Visit: Payer: Self-pay

## 2018-02-15 ENCOUNTER — Emergency Department (HOSPITAL_COMMUNITY)
Admission: EM | Admit: 2018-02-15 | Discharge: 2018-02-15 | Disposition: A | Discharge status: Home / Self Care | Payer: Medicaid Other | Attending: Emergency Medicine | Admitting: Emergency Medicine

## 2018-02-15 ENCOUNTER — Encounter (HOSPITAL_COMMUNITY): Payer: Self-pay | Admitting: Emergency Medicine

## 2018-02-15 DIAGNOSIS — M545 Low back pain: Secondary | ICD-10-CM | POA: Diagnosis not present

## 2018-02-15 DIAGNOSIS — Y92410 Unspecified street and highway as the place of occurrence of the external cause: Secondary | ICD-10-CM | POA: Diagnosis not present

## 2018-02-15 DIAGNOSIS — F17210 Nicotine dependence, cigarettes, uncomplicated: Secondary | ICD-10-CM | POA: Insufficient documentation

## 2018-02-15 DIAGNOSIS — Z79899 Other long term (current) drug therapy: Secondary | ICD-10-CM | POA: Insufficient documentation

## 2018-02-15 DIAGNOSIS — Y999 Unspecified external cause status: Secondary | ICD-10-CM | POA: Insufficient documentation

## 2018-02-15 DIAGNOSIS — T148XXA Other injury of unspecified body region, initial encounter: Secondary | ICD-10-CM | POA: Insufficient documentation

## 2018-02-15 DIAGNOSIS — Y939 Activity, unspecified: Secondary | ICD-10-CM | POA: Diagnosis not present

## 2018-02-15 MED ORDER — ACETAMINOPHEN 500 MG PO TABS
1000.0000 mg | ORAL_TABLET | Freq: Once | ORAL | Status: AC
  Administered 2018-02-15: 1000 mg via ORAL
  Filled 2018-02-15: qty 2

## 2018-02-15 MED ORDER — METHOCARBAMOL 500 MG PO TABS: 500.0000 mg | ORAL_TABLET | Freq: Two times a day (BID) | ORAL | 0 refills | Status: AC
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
## 2018-02-15 NOTE — Discharge Instructions (Signed)

## 2018-08-06 ENCOUNTER — Ambulatory Visit (HOSPITAL_COMMUNITY)
Admission: EM | Admit: 2018-08-06 | Discharge: 2018-08-06 | Disposition: A | Discharge status: Home / Self Care | Payer: Medicaid Other | Attending: Family Medicine | Admitting: Family Medicine

## 2018-08-06 ENCOUNTER — Encounter (HOSPITAL_COMMUNITY): Payer: Self-pay

## 2018-08-06 ENCOUNTER — Ambulatory Visit (INDEPENDENT_AMBULATORY_CARE_PROVIDER_SITE_OTHER): Payer: Medicaid Other

## 2018-08-06 ENCOUNTER — Telehealth (HOSPITAL_COMMUNITY): Payer: Self-pay

## 2018-08-06 DIAGNOSIS — R0789 Other chest pain: Secondary | ICD-10-CM | POA: Diagnosis not present

## 2018-08-06 DIAGNOSIS — R6889 Other general symptoms and signs: Secondary | ICD-10-CM

## 2018-08-06 MED ORDER — ALBUTEROL SULFATE HFA 108 (90 BASE) MCG/ACT IN AERS: 1.0000 | INHALATION_SPRAY | RESPIRATORY_TRACT | 0 refills | Status: DC | PRN

## 2018-08-06 MED ORDER — PROMETHAZINE-DM 6.25-15 MG/5ML PO SYRP: 5.0000 mL | ORAL_SOLUTION | Freq: Four times a day (QID) | ORAL | 0 refills | Status: DC | PRN

## 2018-08-06 MED ORDER — HYDROCOD POLST-CPM POLST ER 10-8 MG/5ML PO SUER: 5.0000 mL | Freq: Every evening | ORAL | 0 refills | Status: DC | PRN

## 2018-08-06 NOTE — Discharge Instructions (Addendum)
Take tylenol and motrin as needed for body aches. Do not take the cough medication when driving as it will make you sleepy. Follow up with your doctor. If symptoms worsen go to the ED for further evaluation.

## 2018-08-06 NOTE — ED Provider Notes (Signed)
[facility]    CSN: [id number] Arrival date & time: 08/06/18  1004     History   Chief Complaint Chief Complaint  Patient presents with  . URI  . Cough  . Chest Pain  . Shortness of Breath    HPI Jessica Owen is a 33 y.o. female who presents to the UC with c/o cough, fever, chills, sore throat, body aches that started a week ago and has continued.  patient reports feeling like she is wheezing and short of breath today. She reports taking OTC medications without relief.   The history is provided by the patient.  URI  Presenting symptoms: congestion, cough, fever and sore throat   Presenting symptoms: no ear pain   Severity:  Moderate Onset quality:  Gradual Duration:  1 week Timing:  Constant Progression:  Waxing and waning Chronicity:  New Relieved by:  Nothing Worsened by:  Nothing Ineffective treatments:  OTC medications Associated symptoms: myalgias, sneezing and wheezing   Associated symptoms: no headaches   Cough  Associated symptoms: chest pain, fever, myalgias, shortness of breath, sore throat and wheezing   Associated symptoms: no ear pain, no eye discharge, no headaches and no rash   Chest Pain  Associated symptoms: cough, fever and shortness of breath   Associated symptoms: no abdominal pain, no headache and no vomiting   Shortness of Breath  Associated symptoms: chest pain, cough, fever, sore throat and wheezing   Associated symptoms: no abdominal pain, no ear pain, no headaches, no rash and no vomiting     History reviewed. No pertinent past medical history.  Patient Active Problem List   Diagnosis Date Noted  . Viral pharyngitis 11/05/2017    Past Surgical History:  Procedure Laterality Date  . CESAREAN SECTION    . TUBAL LIGATION      OB History   No obstetric history on file.      Home Medications    Prior to Admission medications   Medication Sig Start Date End Date Taking? Authorizing Provider  albuterol (PROVENTIL  HFA;VENTOLIN HFA) 108 (90 Base) MCG/ACT inhaler Inhale 1-2 puffs into the lungs every 4 (four) hours as needed for wheezing or shortness of breath. 08/06/18   Janne Napoleon, NP  chlorpheniramine-HYDROcodone [facility]) 10-8 MG/5ML SUER Take 5 mLs by mouth at bedtime as needed for cough. 08/06/18   Janne Napoleon, NP  famotidine (PEPCID) 20 MG tablet Take 1 tablet (20 mg total) by mouth 2 (two) times daily. 10/21/16 10/26/16  Tegeler, Canary Brim, MD    Family History Family History  Problem Relation Age of Onset  . Hypertension Father     Social History Social History   Tobacco Use  . Smoking status: Current Some Day Smoker  . Smokeless tobacco: Never Used  Substance Use Topics  . Alcohol use: No  . Drug use: No     Allergies   Patient has no known allergies.   Review of Systems Review of Systems  Constitutional: Positive for fever.  HENT: Positive for congestion, sneezing and sore throat. Negative for ear discharge and ear pain.   Eyes: Negative for pain, discharge and itching.  Respiratory: Positive for cough, shortness of breath and wheezing.   Cardiovascular: Positive for chest pain.  Gastrointestinal: Negative for abdominal pain and vomiting.  Genitourinary: Negative for decreased urine volume and frequency.  Musculoskeletal: Positive for myalgias.  Skin: Negative for rash.  Neurological: Negative for headaches.  Hematological: Negative for adenopathy.  Psychiatric/Behavioral: Negative  for confusion.     Physical Exam Triage Vital Signs ED Triage Vitals  Enc Vitals Group     BP 08/06/18 1135 127/81     Pulse Rate 08/06/18 1135 60     Resp 08/06/18 1135 18     Temp 08/06/18 1135 98.8 F (37.1 C)     Temp Source 08/06/18 1135 Oral     SpO2 08/06/18 1135 99 %     Weight --      Height --      Head Circumference --      Peak Flow --      Pain Score 08/06/18 1138 7     Pain Loc --      Pain Edu? --      Excl. in GC? --    No data  found.  Updated Vital Signs BP 127/81 (BP Location: Left Arm)   Pulse 60   Temp 98.8 F (37.1 C) (Oral)   Resp 18   LMP 08/02/2018   SpO2 99%    Physical Exam Vitals signs and nursing note reviewed.  Constitutional:      Appearance: She is well-developed.  HENT:     Head: Normocephalic.  Eyes:     Extraocular Movements: Extraocular movements intact.  Neck:     Musculoskeletal: Neck supple.  Cardiovascular:     Rate and Rhythm: Normal rate and regular rhythm.  Pulmonary:     Effort: Pulmonary effort is normal. No respiratory distress.     Breath sounds: Decreased breath sounds present. No rhonchi or rales. Wheezes: occasional.  Abdominal:     Palpations: Abdomen is soft.     Tenderness: There is no abdominal tenderness.  Musculoskeletal: Normal range of motion.  Skin:    General: Skin is warm and dry.  Neurological:     Mental Status: She is alert and oriented to person, place, and time.     Cranial Nerves: No cranial nerve deficit.  Psychiatric:        Mood and Affect: Mood normal.      UC Treatments / Results  Labs (all labs ordered are listed, but only abnormal results are displayed) Labs Reviewed - No data to display Radiology Dg Chest 2 View  Result Date: 08/06/2018 CLINICAL DATA:  Cough and fever with wheezing EXAM: CHEST - 2 VIEW COMPARISON:  None. FINDINGS: Lungs are clear. Heart size and pulmonary vascularity are normal. No adenopathy. No pneumothorax. No bone lesions. IMPRESSION: No edema or consolidation. Electronically Signed   By: Bretta BangWilliam  Woodruff III M.D.   On: 08/06/2018 12:40    Procedures Procedures (including critical care time)  Medications Ordered in UC Medications - No data to display  Initial Impression / Assessment and Plan / UC Course  I have reviewed the triage vital signs and the nursing notes. SUBJECTIVE:  Jessica Foreiffany Owen is a 33 y.o. female who present complaining of flu-like symptoms: fevers, chills, myalgias, congestion, sore  throat and cough for 7 days. Denies dyspnea or wheezing.  OBJECTIVE: Appears moderately ill but not toxic; temperature as noted in vitals. Ears normal. Throat and pharynx normal.  Neck supple. No adenopathy in the neck. Sinuses non tender. The chest is clear.  ASSESSMENT: Influenza like symptoms  PLAN: Symptomatic therapy suggested: rest, increase fluids, gargle prn for sore throat and use mist of vaporizer prn. F/u with PCP or return if symptoms worsen or fail to improve as anticipated.  Final Clinical Impressions(s) / UC Diagnoses   Final diagnoses:  Flu-like symptoms  Chest wall pain     Discharge Instructions     Take tylenol and motrin as needed for body aches. Do not take the cough medication when driving as it will make you sleepy. Follow up with your doctor. If symptoms worsen go to the ED for further evaluation.     ED Prescriptions    Medication Sig Dispense Auth. Provider   chlorpheniramine-HYDROcodone (TUSSIONEX PENNKINETIC ER) 10-8 MG/5ML SUER Take 5 mLs by mouth at bedtime as needed for cough. 70 mL Kerrie Buffalo M, NP   albuterol (PROVENTIL HFA;VENTOLIN HFA) 108 (90 Base) MCG/ACT inhaler Inhale 1-2 puffs into the lungs every 4 (four) hours as needed for wheezing or shortness of breath. 1 Inhaler Kerrie Buffalo [place], NP        Kerrie Buffalo [place], [place] 08/06/18 1256

## 2018-08-06 NOTE — ED Triage Notes (Signed)
Pt presents with persistent cough X 7 days with thick green mucus; pt states she is having chest pain and shortness of breath with cough.  Pt states she is also having fatigue, nasal drainage and headache.

## 2019-05-05 ENCOUNTER — Encounter (HOSPITAL_COMMUNITY): Payer: Self-pay | Admitting: Emergency Medicine

## 2019-05-05 ENCOUNTER — Other Ambulatory Visit: Payer: Self-pay

## 2019-05-05 ENCOUNTER — Emergency Department (HOSPITAL_COMMUNITY)
Admission: EM | Admit: 2019-05-05 | Discharge: 2019-05-05 | Disposition: A | Discharge status: Home / Self Care | Payer: Medicaid Other | Attending: Emergency Medicine | Admitting: Emergency Medicine

## 2019-05-05 DIAGNOSIS — N760 Acute vaginitis: Secondary | ICD-10-CM | POA: Insufficient documentation

## 2019-05-05 DIAGNOSIS — A599 Trichomoniasis, unspecified: Secondary | ICD-10-CM | POA: Insufficient documentation

## 2019-05-05 DIAGNOSIS — R102 Pelvic and perineal pain: Secondary | ICD-10-CM | POA: Diagnosis present

## 2019-05-05 DIAGNOSIS — F17210 Nicotine dependence, cigarettes, uncomplicated: Secondary | ICD-10-CM | POA: Insufficient documentation

## 2019-05-05 DIAGNOSIS — A5901 Trichomonal vulvovaginitis: Secondary | ICD-10-CM

## 2019-05-05 DIAGNOSIS — B9689 Other specified bacterial agents as the cause of diseases classified elsewhere: Secondary | ICD-10-CM

## 2019-05-05 HISTORY — DX: Trichomonal vulvovaginitis: A59.01

## 2019-05-05 LAB — COMPREHENSIVE METABOLIC PANEL
ALT: 16 U/L (ref 0–44)
AST: 18 U/L (ref 15–41)
Albumin: 3.7 g/dL (ref 3.5–5.0)
Alkaline Phosphatase: 52 U/L (ref 38–126)
Anion gap: 9 (ref 5–15)
BUN: 6 mg/dL (ref 6–20)
CO2: 23 mmol/L (ref 22–32)
Calcium: 9 mg/dL (ref 8.9–10.3)
Chloride: 104 mmol/L (ref 98–111)
Creatinine, Ser: 0.79 mg/dL (ref 0.44–1.00)
GFR calc Af Amer: >60 mL/min (ref >60)
GFR calc non Af Amer: >60 mL/min (ref >60)
Glucose, Bld: 101 mg/dL — ABNORMAL HIGH (ref 70–99)
Potassium: 3.9 mmol/L (ref 3.5–5.1)
Sodium: 136 mmol/L (ref 135–145)
Total Bilirubin: 0.3 mg/dL (ref 0.3–1.2)
Total Protein: 7.6 g/dL (ref 6.5–8.1)

## 2019-05-05 LAB — CBC
HCT: 41.4 % (ref 36.0–46.0)
Hemoglobin: 11.5 g/dL — ABNORMAL LOW (ref 12.0–15.0)
MCH: 22.3 pg — ABNORMAL LOW (ref 26.0–34.0)
MCHC: 27.8 g/dL — ABNORMAL LOW (ref 30.0–36.0)
MCV: 80.4 fL (ref 80.0–100.0)
Platelets: 393 10*3/uL (ref 150–400)
RBC: 5.15 MIL/uL — ABNORMAL HIGH (ref 3.87–5.11)
RDW: 14.6 % (ref 11.5–15.5)
WBC: 4.7 10*3/uL (ref 4.0–10.5)
nRBC: 0 % (ref 0.0–0.2)

## 2019-05-05 LAB — URINALYSIS, ROUTINE W REFLEX MICROSCOPIC
Bilirubin Urine: NEGATIVE
Glucose, UA: NEGATIVE mg/dL
Hgb urine dipstick: NEGATIVE
Ketones, ur: NEGATIVE mg/dL
Nitrite: NEGATIVE
Protein, ur: NEGATIVE mg/dL
Specific Gravity, Urine: 1.026 (ref 1.005–1.030)
pH: 6 (ref 5.0–8.0)

## 2019-05-05 LAB — WET PREP, GENITAL
Sperm: NONE SEEN
Yeast Wet Prep HPF POC: NONE SEEN

## 2019-05-05 LAB — LIPASE, BLOOD: Lipase: 20 U/L (ref 11–51)

## 2019-05-05 LAB — I-STAT BETA HCG BLOOD, ED (MC, WL, AP ONLY): I-stat hCG, quantitative: <5 m[IU]/mL (ref <5)

## 2019-05-05 MED ORDER — AZITHROMYCIN 1 G PO PACK
1.0000 g | PACK | Freq: Once | ORAL | Status: AC
  Administered 2019-05-05: 1 g via ORAL
  Filled 2019-05-05: qty 1

## 2019-05-05 MED ORDER — METRONIDAZOLE 500 MG PO TABS: 500.0000 mg | ORAL_TABLET | Freq: Three times a day (TID) | ORAL | 0 refills | Status: DC

## 2019-05-05 MED ORDER — SODIUM CHLORIDE 0.9% FLUSH: 3.0000 mL | Freq: Once | INTRAVENOUS | Status: DC

## 2019-05-05 MED ORDER — METRONIDAZOLE 500 MG PO TABS
500.0000 mg | ORAL_TABLET | Freq: Once | ORAL | Status: AC
  Administered 2019-05-05: 18:00:00 500 mg via ORAL
  Filled 2019-05-05: qty 1

## 2019-05-05 MED ORDER — STERILE WATER FOR INJECTION IJ SOLN
INTRAMUSCULAR | Status: AC
  Filled 2019-05-05: qty 10

## 2019-05-05 MED ORDER — CEFTRIAXONE SODIUM 250 MG IJ SOLR
250.0000 mg | Freq: Once | INTRAMUSCULAR | Status: AC
  Administered 2019-05-05: 250 mg via INTRAMUSCULAR
  Filled 2019-05-05: qty 250

## 2019-05-05 NOTE — ED Notes (Signed)
Patient verbalizes understanding of discharge instructions. Opportunity for questioning and answers were provided. Armband removed by staff, pt discharged from ED ambulatory to home.  

## 2019-05-05 NOTE — Discharge Instructions (Addendum)
You have bacterial vaginosis and trichomonas. Take flagyl three times daily for a week   You are treated for gonorrhea and chlamydia today. Your test result should come back in 48 hrs. You will be notified for positive results, in which case your partner needs treatment   Use condoms   See your doctor  Return to ER if you have worse vaginal discharge, fever, vomiting

## 2019-05-05 NOTE — ED Triage Notes (Signed)
Pt reports occasional sharp pelvic pains. States she thinks it maybe from a new sexual partner. Denies any vaginal discharge, itching, or UTI symptoms

## 2019-05-05 NOTE — ED Provider Notes (Signed)
[facility] EMERGENCY DEPARTMENT Provider Note   CSN: [id number] Arrival date & time: 05/05/19  1507     History   Chief Complaint Chief Complaint  Patient presents with  . Pelvic Pain    HPI Carolie Mcilrath is a 33 y.o. female otherwise healthy here presenting with pelvic pain.  Patient states that she had a new female partner and they had unprotected sex about 10 days ago.  She has been having some mild dysuria as well as mild pelvic pain.  Denies any pelvic discharge.  Denies any fevers or chills or vomiting.      The history is provided by the patient.    History reviewed. No pertinent past medical history.  Patient Active Problem List   Diagnosis Date Noted  . Viral pharyngitis 11/05/2017    Past Surgical History:  Procedure Laterality Date  . CESAREAN SECTION    . TUBAL LIGATION       OB History   No obstetric history on file.      Home Medications    Prior to Admission medications   Medication Sig Start Date End Date Taking? Authorizing Provider  albuterol (PROVENTIL HFA;VENTOLIN HFA) 108 (90 Base) MCG/ACT inhaler Inhale 1-2 puffs into the lungs every 4 (four) hours as needed for wheezing or shortness of breath. 08/06/18   Janne Napoleon, NP  famotidine (PEPCID) 20 MG tablet Take 1 tablet (20 mg total) by mouth 2 (two) times daily. 10/21/16 10/26/16  Tegeler, Canary Brim, MD  promethazine-dextromethorphan (PROMETHAZINE-DM) 6.25-15 MG/5ML syrup Take 5 mLs by mouth 4 (four) times daily as needed for cough. 08/06/18   Eustace Moore, MD    Family History Family History  Problem Relation Age of Onset  . Hypertension Father     Social History Social History   Tobacco Use  . Smoking status: Current Some Day Smoker  . Smokeless tobacco: Never Used  Substance Use Topics  . Alcohol use: No  . Drug use: No     Allergies   Patient has no known allergies.   Review of Systems Review of Systems  Genitourinary: Positive for pelvic  pain.  All other systems reviewed and are negative.    Physical Exam Updated Vital Signs BP 116/69   Pulse 83   Temp 98.6 F (37 C) (Oral)   Resp 16   SpO2 100%   Physical Exam Vitals signs and nursing note reviewed.  HENT:     Head: Normocephalic.     Right Ear: Tympanic membrane normal.     Left Ear: Tympanic membrane normal.     Nose: Nose normal.     Mouth/Throat:     Mouth: Mucous membranes are moist.  Eyes:     Extraocular Movements: Extraocular movements intact.     Pupils: Pupils are equal, round, and reactive to light.  Neck:     Musculoskeletal: Normal range of motion.  Cardiovascular:     Rate and Rhythm: Normal rate.     Pulses: Normal pulses.     Heart sounds: Normal heart sounds.  Pulmonary:     Effort: Pulmonary effort is normal.     Breath sounds: Normal breath sounds.  Abdominal:     General: Abdomen is flat.     Palpations: Abdomen is soft.  Genitourinary:    General: Normal vulva.     Comments: No obvious discharge, no CMT or adnexal tenderness  Musculoskeletal: Normal range of motion.  Skin:    General: Skin is warm.  Capillary Refill: Capillary refill takes less than 2 seconds.  Neurological:     General: No focal deficit present.     Mental Status: She is alert and oriented to person, place, and time.  Psychiatric:        Mood and Affect: Mood normal.        Behavior: Behavior normal.      ED Treatments / Results  Labs (all labs ordered are listed, but only abnormal results are displayed) Labs Reviewed  COMPREHENSIVE METABOLIC PANEL - Abnormal; Notable for the following components:      Result Value   Glucose, Bld 101 (*)    All other components within normal limits  CBC - Abnormal; Notable for the following components:   RBC 5.15 (*)    Hemoglobin 11.5 (*)    MCH 22.3 (*)    MCHC 27.8 (*)    All other components within normal limits  URINALYSIS, ROUTINE W REFLEX MICROSCOPIC - Abnormal; Notable for the following components:    APPearance HAZY (*)    Leukocytes,Ua SMALL (*)    Bacteria, UA RARE (*)    All other components within normal limits  WET PREP, GENITAL  LIPASE, BLOOD  I-STAT BETA HCG BLOOD, ED ([facility], [facility], [facility] ONLY)  WET PREP  (BD AFFIRM) ([place])  GC/CHLAMYDIA PROBE AMP ([place]) NOT AT [facility]    EKG None  Radiology No results found.  Procedures Procedures (including critical care time)  Medications Ordered in ED Medications  sodium chloride flush (NS) 0.9 % injection 3 mL (3 mLs Intravenous Not Given 05/05/19 1624)     Initial Impression / Assessment and Plan / ED Course  I have reviewed the triage vital signs and the nursing notes.  Pertinent labs & imaging results that were available during my care of the patient were reviewed by me and considered in my medical decision making (see chart for details).       Becca Bayne is a 33 y.o. female here with pelvic pain after unprotected sex. No CMT or adnexal tenderness, no obvious discharge. Consider GC/chlamydia vs BV vs trichomonas.   6:29 PM Wet prep + BV and trichomonas.  I told her that trichomonas is oftentimes associated with STD.  Her UA is normal.  At this point she is agreeable to get empiric treatment for GC and chlamydia and will be discharged home with a course of Flagyl for BV and trichomonas    Final Clinical Impressions(s) / ED Diagnoses   Final diagnoses:  None    ED Discharge Orders    None       Drenda Freeze, MD 05/05/19 [phone]

## 2019-05-07 LAB — GC/CHLAMYDIA PROBE AMP (~~LOC~~) NOT AT ARMC
Chlamydia: NEGATIVE
Neisseria Gonorrhea: NEGATIVE

## 2019-05-15 ENCOUNTER — Encounter: Payer: Self-pay | Admitting: Obstetrics & Gynecology

## 2019-05-15 ENCOUNTER — Other Ambulatory Visit: Payer: Self-pay

## 2019-05-15 ENCOUNTER — Ambulatory Visit (INDEPENDENT_AMBULATORY_CARE_PROVIDER_SITE_OTHER): Payer: Medicaid Other | Admitting: Obstetrics & Gynecology

## 2019-05-15 ENCOUNTER — Other Ambulatory Visit (HOSPITAL_COMMUNITY)
Admission: RE | Admit: 2019-05-15 | Discharge: 2019-05-15 | Disposition: A | Discharge status: Home / Self Care | Payer: Medicaid Other | Source: Ambulatory Visit | Attending: Obstetrics & Gynecology | Admitting: Obstetrics & Gynecology

## 2019-05-15 VITALS — BP 122/82 | HR 72 | Wt 245.6 lb

## 2019-05-15 DIAGNOSIS — R600 Localized edema: Secondary | ICD-10-CM

## 2019-05-15 DIAGNOSIS — Z01419 Encounter for gynecological examination (general) (routine) without abnormal findings: Secondary | ICD-10-CM | POA: Insufficient documentation

## 2019-05-15 DIAGNOSIS — R002 Palpitations: Secondary | ICD-10-CM

## 2019-05-15 DIAGNOSIS — Z23 Encounter for immunization: Secondary | ICD-10-CM

## 2019-05-15 DIAGNOSIS — Z0000 Encounter for general adult medical examination without abnormal findings: Secondary | ICD-10-CM

## 2019-05-15 DIAGNOSIS — A64 Unspecified sexually transmitted disease: Secondary | ICD-10-CM

## 2019-05-15 MED ORDER — TETANUS-DIPHTH-ACELL PERTUSSIS 5-2.5-18.5 LF-MCG/0.5 IM SUSP
0.5000 mL | Freq: Once | INTRAMUSCULAR | Status: AC
  Administered 2019-05-15: 0.5 mL via INTRAMUSCULAR

## 2019-05-15 NOTE — Patient Instructions (Addendum)
Thank you for enrolling in [place] [facility]. Please follow the instructions below to securely access your online medical record. MyChart allows you to send messages to your doctor, view your test results, manage appointments, and more.   How Do I Sign Up? 1. In your Internet browser, go to [employer] and enter [email or web address]. 2. Click on the Sign Up Now link in the Sign In box. You will see the New Member Sign Up page. 3. Enter your MyChart Access Code exactly as it appears below. You will not need to use this code after you've completed the sign-up process. If you do not sign up before the expiration date, you must request a new code.  MyChart Access Code: [email or web address] Expires: 06/29/2019  2:52 PM  4. Enter your Social Security Number ([id number]) and Date of Birth (mm/dd/yyyy) as indicated and click Submit. You will be taken to the next sign-up page. 5. Create a MyChart ID. This will be your MyChart login ID and cannot be changed, so think of one that is secure and easy to remember. 6. Create a MyChart password. You can change your password at any time. 7. Enter your Password Reset Question and Answer. This can be used at a later time if you forget your password.  8. Enter your e-mail address. You will receive e-mail notification when new information is available in [facility]. 9. Click Sign Up. You can now view your medical record.   Additional Information Remember, MyChart is NOT to be used for urgent needs. For medical emergencies, dial 911.   You were referred to: [facility] [place], [place] [address] [phone] For evaluation of her symptoms    Preventive Care 45-92 Years Old, Female Preventive care refers to visits with your health care provider and lifestyle choices that can promote health and wellness. This includes:  A yearly physical exam. This may also be called an annual well check.  Regular dental visits and  eye exams.  Immunizations.  Screening for certain conditions.  Healthy lifestyle choices, such as eating a healthy diet, getting regular exercise, not using drugs or products that contain nicotine and tobacco, and limiting alcohol use. What can I expect for my preventive care visit? Physical exam Your health care provider will check your:  Height and weight. This may be used to calculate body mass index (BMI), which tells if you are at a healthy weight.  Heart rate and blood pressure.  Skin for abnormal spots. Counseling Your health care provider may ask you questions about your:  Alcohol, tobacco, and drug use.  Emotional well-being.  Home and relationship well-being.  Sexual activity.  Eating habits.  Work and work Statistician.  Method of birth control.  Menstrual cycle.  Pregnancy history. What immunizations do I need?  Influenza (flu) vaccine  This is recommended every year. Tetanus, diphtheria, and pertussis (Tdap) vaccine  You may need a Td booster every 10 years. Varicella (chickenpox) vaccine  You may need this if you have not been vaccinated. Human papillomavirus (HPV) vaccine  If recommended by your health care provider, you may need three doses over 6 months. Measles, mumps, and rubella (MMR) vaccine  You may need at least one dose of MMR. You may also need a second dose. Meningococcal conjugate (MenACWY) vaccine  One dose is recommended if you are age 8-21 years and a first-year college student living in a residence hall, or if you have one of several medical conditions. You may also need  additional booster doses. Pneumococcal conjugate (PCV13) vaccine  You may need this if you have certain conditions and were not previously vaccinated. Pneumococcal polysaccharide (PPSV23) vaccine  You may need one or two doses if you smoke cigarettes or if you have certain conditions. Hepatitis A vaccine  You may need this if you have certain conditions or  if you travel or work in places where you may be exposed to hepatitis A. Hepatitis B vaccine  You may need this if you have certain conditions or if you travel or work in places where you may be exposed to hepatitis B. Haemophilus influenzae type b (Hib) vaccine  You may need this if you have certain conditions. You may receive vaccines as individual doses or as more than one vaccine together in one shot (combination vaccines). Talk with your health care provider about the risks and benefits of combination vaccines. What tests do I need?  Blood tests  Lipid and cholesterol levels. These may be checked every 5 years starting at age 76.  Hepatitis C test.  Hepatitis B test. Screening  Diabetes screening. This is done by checking your blood sugar (glucose) after you have not eaten for a while (fasting).  Sexually transmitted disease (STD) testing.  BRCA-related cancer screening. This may be done if you have a family history of breast, ovarian, tubal, or peritoneal cancers.  Pelvic exam and Pap test. This may be done every 3 years starting at age 74. Starting at age 5, this may be done every 5 years if you have a Pap test in combination with an HPV test. Talk with your health care provider about your test results, treatment options, and if necessary, the need for more tests. Follow these instructions at home: Eating and drinking   Eat a diet that includes fresh fruits and vegetables, whole grains, lean protein, and low-fat dairy.  Take vitamin and mineral supplements as recommended by your health care provider.  Do not drink alcohol if: ? Your health care provider tells you not to drink. ? You are pregnant, may be pregnant, or are planning to become pregnant.  If you drink alcohol: ? Limit how much you have to 0-1 drink a day. ? Be aware of how much alcohol is in your drink. In the [place] one drink equals one 12 oz bottle of beer (355 mL), one 5 oz glass of wine (148 mL), or one  1 oz glass of hard liquor (44 mL). Lifestyle  Take daily care of your teeth and gums.  Stay active. Exercise for at least 30 minutes on 5 or more days each week.  Do not use any products that contain nicotine or tobacco, such as cigarettes, e-cigarettes, and chewing tobacco. If you need help quitting, ask your health care provider.  If you are sexually active, practice safe sex. Use a condom or other form of birth control (contraception) in order to prevent pregnancy and STIs (sexually transmitted infections). If you plan to become pregnant, see your health care provider for a preconception visit. What's next?  Visit your health care provider once a year for a well check visit.  Ask your health care provider how often you should have your eyes and teeth checked.  Stay up to date on all vaccines. This information is not intended to replace advice given to you by your health care provider. Make sure you discuss any questions you have with your health care provider. Document Released: 08/15/2001 Document Revised: 02/28/2018 Document Reviewed: 02/28/2018 Elsevier Patient Education  [place].

## 2019-05-15 NOTE — Progress Notes (Signed)
GYNECOLOGY ANNUAL PREVENTATIVE CARE ENCOUNTER NOTE  History:     Jessica Owen is a 33 y.o. [id number] female here to establish care and for a routine annual gynecologic exam.  Current complaints: recently diagnosed with trichomonal and bacterial vaginitis. Had negative GC/Chlam, but desires serum STI testing. Also reports periodic heart palpitations and bilateral lower extremity swelling, worried about her heart. No overt CP or SOB reported.    Denies abnormal vaginal bleeding, discharge, pelvic pain, problems with intercourse or other gynecologic concerns.    Gynecologic History Patient's last menstrual period was 05/04/2019. Contraception: tubal ligation Last Pap: Unsure. Results were: normal.  Obstetric History OB History  Gravida Para Term Preterm AB Living  4 4 4         SAB TAB Ectopic Multiple Live Births               # Outcome Date GA Lbr Len/2nd Weight Sex Delivery Anes PTL Lv  4 Term 06/20/10    F CS-LTranv     3 Term 01/05/09    F CS-LTranv     2 Term 12/19/07    F CS-LTranv  N   1 Term 09/15/04    Berenice Bouton       Obstetric Comments  Tubal Ligation 06/20/2010    Past Medical History:  Diagnosis Date  . Trichomonas vaginitis 05/05/2019    Past Surgical History:  Procedure Laterality Date  . CESAREAN SECTION     x 4  . TUBAL LIGATION      Current Outpatient Medications on File Prior to Visit  Medication Sig Dispense Refill  . albuterol (PROVENTIL HFA;VENTOLIN HFA) 108 (90 Base) MCG/ACT inhaler Inhale 1-2 puffs into the lungs every 4 (four) hours as needed for wheezing or shortness of breath. (Patient not taking: Reported on 05/15/2019) 1 Inhaler 0  . famotidine (PEPCID) 20 MG tablet Take 1 tablet (20 mg total) by mouth 2 (two) times daily. 10 tablet 0  . metroNIDAZOLE (FLAGYL) 500 MG tablet Take 1 tablet (500 mg total) by mouth 3 (three) times daily. One po bid x 7 days (Patient not taking: Reported on 05/15/2019) 21 tablet 0  .  promethazine-dextromethorphan (PROMETHAZINE-DM) 6.25-15 MG/5ML syrup Take 5 mLs by mouth 4 (four) times daily as needed for cough. (Patient not taking: Reported on 05/15/2019) 118 mL 0   No current facility-administered medications on file prior to visit.     No Known Allergies  Social History:  reports that she has been smoking. She has never used smokeless tobacco. She reports that she does not drink alcohol or use drugs.  Family History  Problem Relation Age of Onset  . Hypertension Father     The following portions of the patient's history were reviewed and updated as appropriate: allergies, current medications, past family history, past medical history, past social history, past surgical history and problem list.  Review of Systems Pertinent items noted in HPI and remainder of comprehensive ROS otherwise negative.  Physical Exam:  BP 122/82   Pulse 72   Wt 245 lb 9.6 oz (111.4 kg)   LMP 05/04/2019   BMI 36.27 kg/m  CONSTITUTIONAL: Well-developed, well-nourished female in no acute distress.  HENT:  Normocephalic, atraumatic, External right and left ear normal. Oropharynx is clear and moist EYES: Conjunctivae and EOM are normal. Pupils are equal, round, and reactive to light. No scleral icterus.  NECK: Normal range of motion, supple, no masses.  Normal thyroid.  SKIN: Skin is warm and dry. No rash  noted. Not diaphoretic. No erythema. No pallor. MUSCULOSKELETAL: Normal range of motion. No tenderness.  No cyanosis, clubbing, or edema.  2+ distal pulses. NEUROLOGIC: Alert and oriented to person, place, and time. Normal reflexes, muscle tone coordination.  PSYCHIATRIC: Normal mood and affect. Normal behavior. Normal judgment and thought content. CARDIOVASCULAR: Normal heart rate noted, regular rhythm RESPIRATORY: Clear to auscultation bilaterally. Effort and breath sounds normal, no problems with respiration noted. BREASTS: Symmetric in size. No masses, tenderness, skin changes,  nipple drainage, or lymphadenopathy bilaterally. ABDOMEN: Soft, no distention noted.  No tenderness, rebound or guarding.  PELVIC: Normal appearing external genitalia and urethral meatus; normal appearing vaginal mucosa and cervix.  White vaginal discharge noted.  Pap smear obtained, cervix was very friable likely due to recent infection, also could not enter endocervical canal with brush. Had to stop bleeding with silver nitrate.  Patient was very tender during exam. Bimanual exam deferred.     Assessment and Plan:    1. Heart palpitations 2. Bilateral lower extremity edema Referred for Internal Medicine evaluation given her symptoms. Will follow up recommendations.  - Ambulatory referral to [facility]  3. STI (sexually transmitted infection) Safe sex recommended. ST screen done. Needs to return in 1 month for test of cure for the trichomonal vaginitis. - Hepatitis B surface antigen - Hepatitis C antibody - HIV antibody - RPR  4. Well woman exam with routine gynecological exam Will follow up results of pap smear and manage accordingly. - Cytology - PAP( [facility])  5. Need for Tdap vaccination Overdue for booster. Tdap given. Declined flu vaccine. - Tdap (BOOSTRIX) injection 0.5 mL Routine preventative health maintenance measures emphasized. Please refer to After Visit Summary for other counseling recommendations.      Jaynie Collins, MD, FACOG Obstetrician & Gynecologist, [facility] for [employer], [facility]

## 2019-05-16 LAB — RPR: RPR Ser Ql: NONREACTIVE

## 2019-05-16 LAB — HEPATITIS B SURFACE ANTIGEN: Hepatitis B Surface Ag: NEGATIVE

## 2019-05-16 LAB — HIV ANTIBODY (ROUTINE TESTING W REFLEX): HIV Screen 4th Generation wRfx: NONREACTIVE

## 2019-05-16 LAB — HEPATITIS C ANTIBODY: Hep C Virus Ab: <0.1 s/co ratio (ref 0.0–0.9)

## 2019-05-21 LAB — CYTOLOGY - PAP
Comment: NEGATIVE
Diagnosis: NEGATIVE
High risk HPV: NEGATIVE

## 2019-06-03 ENCOUNTER — Other Ambulatory Visit: Payer: Self-pay

## 2019-06-03 MED ORDER — METRONIDAZOLE 500 MG PO TABS: 500.0000 mg | ORAL_TABLET | Freq: Two times a day (BID) | ORAL | 0 refills | Status: DC

## 2019-06-03 NOTE — Telephone Encounter (Signed)
Received call from patient she is requesting another dose of flagyl due to having the same symptoms of trich or bv. She reports testing positive for Trich on 05/05/2019 and her symptoms never got better. She reports vaginal discharge and odor. She reports not having sex with the person who give her the infection. I have advised patient I will call in the medication but we need her to come back in for a  TOC in 3-4 weeks due to the upcoming holiday it may have to be three weeks. Patient voice understanding and appointment has been schedule.

## 2019-06-25 ENCOUNTER — Ambulatory Visit: Payer: Medicaid Other

## 2019-07-07 ENCOUNTER — Other Ambulatory Visit: Payer: Self-pay

## 2019-07-07 ENCOUNTER — Emergency Department (HOSPITAL_COMMUNITY)
Admission: EM | Admit: 2019-07-07 | Discharge: 2019-07-07 | Disposition: A | Discharge status: Home / Self Care | Payer: Medicaid Other | Attending: Emergency Medicine | Admitting: Emergency Medicine

## 2019-07-07 ENCOUNTER — Encounter (HOSPITAL_COMMUNITY): Payer: Self-pay | Admitting: Emergency Medicine

## 2019-07-07 DIAGNOSIS — B9689 Other specified bacterial agents as the cause of diseases classified elsewhere: Secondary | ICD-10-CM

## 2019-07-07 DIAGNOSIS — N898 Other specified noninflammatory disorders of vagina: Secondary | ICD-10-CM | POA: Diagnosis present

## 2019-07-07 DIAGNOSIS — B373 Candidiasis of vulva and vagina: Secondary | ICD-10-CM

## 2019-07-07 DIAGNOSIS — N760 Acute vaginitis: Secondary | ICD-10-CM | POA: Diagnosis not present

## 2019-07-07 DIAGNOSIS — F17210 Nicotine dependence, cigarettes, uncomplicated: Secondary | ICD-10-CM | POA: Insufficient documentation

## 2019-07-07 DIAGNOSIS — B3731 Acute candidiasis of vulva and vagina: Secondary | ICD-10-CM

## 2019-07-07 LAB — WET PREP, GENITAL
Sperm: NONE SEEN
Trich, Wet Prep: NONE SEEN

## 2019-07-07 LAB — URINALYSIS, ROUTINE W REFLEX MICROSCOPIC
Bilirubin Urine: NEGATIVE
Glucose, UA: NEGATIVE mg/dL
Hgb urine dipstick: NEGATIVE
Ketones, ur: NEGATIVE mg/dL
Leukocytes,Ua: NEGATIVE
Nitrite: NEGATIVE
Protein, ur: NEGATIVE mg/dL
Specific Gravity, Urine: 1.019 (ref 1.005–1.030)
pH: 6 (ref 5.0–8.0)

## 2019-07-07 LAB — PREGNANCY, URINE: Preg Test, Ur: NEGATIVE

## 2019-07-07 LAB — HIV ANTIBODY (ROUTINE TESTING W REFLEX): HIV Screen 4th Generation wRfx: NONREACTIVE

## 2019-07-07 MED ORDER — FLUCONAZOLE 150 MG PO TABS: ORAL_TABLET | ORAL | 0 refills | Status: DC

## 2019-07-07 MED ORDER — METRONIDAZOLE 500 MG PO TABS: 500.0000 mg | ORAL_TABLET | Freq: Two times a day (BID) | ORAL | 0 refills | Status: DC

## 2019-07-07 NOTE — Discharge Instructions (Signed)
There is evidence of yeast infection on the wet prep. Take the first dose of Diflucan today.  Should symptoms persist, may take a second dose of Diflucan on day 3 after the first dose.  You have been tested for STDs. Some of these results are still pending. Any abnormalities will be called to you.  Be sure to follow safe sex practices, including monogamy and/or condom use.  If STD testing results are positive, all sexual partners must also be notified and treated.  Any future STD testing or treatment should be performed at the [facility] department or primary care office. If treatment for STDs is required, for the treatment to be fully effective, avoid all sexual contact for at least 2 weeks after medication administration. Otherwise, you will infect your partner(s) and/or risk reinfecting yourself. Should symptoms fail to improve within 1 week, follow up with a primary care provider or go to the [facility].

## 2019-07-07 NOTE — ED Triage Notes (Signed)
Pt reports she has an ulcer on right side of labia for couple days. Denies any drainage. Wants STD testing.

## 2019-07-07 NOTE — ED Notes (Signed)
Pt refuses discharge vital signs. 

## 2019-07-07 NOTE — ED Provider Notes (Signed)
[facility]-EMERGENCY DEPT Provider Note   CSN: [id number] Arrival date & time: 07/07/19  1236     History Chief Complaint  Patient presents with  . vaginal sore  . wants STD test    Jessica Owen is a 34 y.o. female.  HPI      Jessica Owen is a 34 y.o. female, with a history of trichomonas, presenting to the ED with vaginal discharge beginning 3 days ago.  Discharge is white and abundant. Also notes a sore on the right labia beginning 3 days ago.  This was initially painful, but no pain now. Denies fever/chills, abdominal pain, nausea/vomiting, vaginal bleeding, dysuria, dyspareunia, or any other complaint.    Past Medical History:  Diagnosis Date  . Trichomonas vaginitis 05/05/2019    Patient Active Problem List   Diagnosis Date Noted  . Viral pharyngitis 11/05/2017    Past Surgical History:  Procedure Laterality Date  . CESAREAN SECTION     x 4  . TUBAL LIGATION       OB History    Gravida  4   Para  4   Term  4   Preterm      AB      Living        SAB      TAB      Ectopic      Multiple      Live Births           Obstetric Comments  Tubal Ligation 06/20/2010        Family History  Problem Relation Age of Onset  . Hypertension Father     Social History   Tobacco Use  . Smoking status: Current Some Day Smoker  . Smokeless tobacco: Never Used  Substance Use Topics  . Alcohol use: No  . Drug use: No    Home Medications Prior to Admission medications   Medication Sig Start Date End Date Taking? Authorizing Provider  albuterol (PROVENTIL HFA;VENTOLIN HFA) 108 (90 Base) MCG/ACT inhaler Inhale 1-2 puffs into the lungs every 4 (four) hours as needed for wheezing or shortness of breath. Patient not taking: Reported on 05/15/2019 08/06/18   Janne Napoleon, NP  famotidine (PEPCID) 20 MG tablet Take 1 tablet (20 mg total) by mouth 2 (two) times daily. 10/21/16 10/26/16  Tegeler, Canary Brim, MD  fluconazole  (DIFLUCAN) 150 MG tablet Take 1 tablet (150 mg) by mouth on day 1. If symptoms are still present in 3 days, take the second tablet. 07/07/19   Airyn Ellzey C, PA-C  metroNIDAZOLE (FLAGYL) 500 MG tablet Take 1 tablet (500 mg total) by mouth 2 (two) times daily. 07/07/19   Esbeidy Mclaine C, PA-C  promethazine-dextromethorphan (PROMETHAZINE-DM) 6.25-15 MG/5ML syrup Take 5 mLs by mouth 4 (four) times daily as needed for cough. Patient not taking: Reported on 05/15/2019 08/06/18   Eustace Moore, MD    Allergies    Patient has no known allergies.  Review of Systems   Review of Systems  Constitutional: Negative for fever.  Gastrointestinal: Negative for abdominal pain, nausea and vomiting.  Genitourinary: Positive for genital sores and vaginal discharge. Negative for dysuria, flank pain, hematuria and vaginal bleeding.  Musculoskeletal: Negative for back pain.  All other systems reviewed and are negative.   Physical Exam Updated Vital Signs BP 129/82   Pulse 75   Temp 99.5 F (37.5 C) (Oral)   Resp 17   LMP 07/19/2018   SpO2 100%   Physical  Exam Vitals and nursing note reviewed.  Constitutional:      General: She is not in acute distress.    Appearance: She is well-developed. She is not diaphoretic.  HENT:     Head: Normocephalic and atraumatic.     Mouth/Throat:     Mouth: Mucous membranes are moist.     Pharynx: Oropharynx is clear.  Eyes:     Conjunctiva/sclera: Conjunctivae normal.  Cardiovascular:     Rate and Rhythm: Normal rate and regular rhythm.     Pulses: Normal pulses.          Radial pulses are 2+ on the right side and 2+ on the left side.  Pulmonary:     Effort: Pulmonary effort is normal. No respiratory distress.  Abdominal:     Palpations: Abdomen is soft.     Tenderness: There is no abdominal tenderness. There is no guarding.  Genitourinary:      Comments: External genitalia abnormal - See picture Vagina with discharge - Thick, white vaginal discharge Cervix   normal negative for cervical motion tenderness Adnexa palpated, no masses, negative for tenderness noted Bladder palpated negative for tenderness Uterus palpated no masses, negative for tenderness  No inguinal lymphadenopathy. Otherwise normal female genitalia. RN, Darl Pikes, served as chaperone during exam. Musculoskeletal:     Cervical back: Neck supple.  Lymphadenopathy:     Cervical: No cervical adenopathy.  Skin:    General: Skin is warm and dry.  Neurological:     Mental Status: She is alert.  Psychiatric:        Mood and Affect: Mood and affect normal.        Speech: Speech normal.        Behavior: Behavior normal.     ED Results / Procedures / Treatments   Labs (all labs ordered are listed, but only abnormal results are displayed) Labs Reviewed  WET PREP, GENITAL - Abnormal; Notable for the following components:      Result Value   Yeast Wet Prep HPF POC PRESENT (*)    Clue Cells Wet Prep HPF POC PRESENT (*)    WBC, Wet Prep HPF POC FEW (*)    All other components within normal limits  URINALYSIS, ROUTINE W REFLEX MICROSCOPIC  PREGNANCY, URINE  HIV ANTIBODY (ROUTINE TESTING W REFLEX)  RPR  GC/CHLAMYDIA PROBE AMP ([place]) NOT AT [facility]    EKG None  Radiology No results found.  Procedures Procedures (including critical care time)  Medications Ordered in ED Medications - No data to display  ED Course  I have reviewed the triage vital signs and the nursing notes.  Pertinent labs & imaging results that were available during my care of the patient were reviewed by me and considered in my medical decision making (see chart for details).    MDM Rules/Calculators/A&P                      Patient presents with vaginal discharge and genital lesion. Based on patient's description of previous appearance of the lesion, herpes infection is a possibility.  This was discussed with the patient.  Valtrex was offered with explanation, but patient declined.  She states  she will follow-up with OB/GYN on this matter.  Yeast and clue cells noted on wet prep.  Patient treated for these issues.  The patient was given instructions for home care as well as return precautions. Patient voices understanding of these instructions, accepts the plan, and is comfortable with discharge.  Final Clinical Impression(s) / ED Diagnoses Final diagnoses:  Vaginal candidiasis  BV (bacterial vaginosis)    Rx / DC Orders ED Discharge Orders         Ordered    fluconazole (DIFLUCAN) 150 MG tablet     07/07/19 1723    metroNIDAZOLE (FLAGYL) 500 MG tablet  2 times daily     07/07/19 1723           Layla Maw 07/07/19 2057    Isla Pence, MD 07/08/19 [phone]

## 2019-07-08 LAB — GC/CHLAMYDIA PROBE AMP (~~LOC~~) NOT AT ARMC
Chlamydia: NEGATIVE
Neisseria Gonorrhea: NEGATIVE

## 2019-07-08 LAB — RPR: RPR Ser Ql: NONREACTIVE

## 2019-08-15 ENCOUNTER — Other Ambulatory Visit: Payer: Self-pay

## 2019-08-15 ENCOUNTER — Encounter (HOSPITAL_COMMUNITY): Payer: Self-pay

## 2019-08-15 ENCOUNTER — Emergency Department (HOSPITAL_COMMUNITY)
Admission: EM | Admit: 2019-08-15 | Discharge: 2019-08-15 | Disposition: A | Discharge status: Home / Self Care | Payer: Medicaid Other | Attending: Emergency Medicine | Admitting: Emergency Medicine

## 2019-08-15 DIAGNOSIS — R21 Rash and other nonspecific skin eruption: Secondary | ICD-10-CM | POA: Insufficient documentation

## 2019-08-15 DIAGNOSIS — Z87891 Personal history of nicotine dependence: Secondary | ICD-10-CM | POA: Diagnosis not present

## 2019-08-15 DIAGNOSIS — J029 Acute pharyngitis, unspecified: Secondary | ICD-10-CM | POA: Insufficient documentation

## 2019-08-15 MED ORDER — PENICILLIN V POTASSIUM 500 MG PO TABS: 500.0000 mg | ORAL_TABLET | Freq: Four times a day (QID) | ORAL | 0 refills | Status: AC

## 2019-08-15 NOTE — ED Triage Notes (Signed)
Patient c/o sore throat x 3 days.  Patient also c/o rash on her right chest, stomach and right arm x 1 1/2 weeks.

## 2019-08-15 NOTE — ED Provider Notes (Signed)
[facility]-EMERGENCY DEPT [facility] Emergency Department Provider Note MRN:  [id number]  Arrival date & time: 08/15/19     Chief Complaint   Sore Throat and Rash   History of Present Illness   Jessica Owen is a 34 y.o. year-old female with no pertinent past medical history presenting to the ED with chief complaint of sore throat.  Over 1 week of red bumpy rash, first noticed in the right axilla and right chest, spread to the right abdomen.  Not painful, not itchy.  Over the past few days has developed a sore throat with white exudate to the tonsils.  Pain is mild to moderate.  No fever, no cough, no chest pain or shortness of breath, no new detergents or medications.  Review of Systems  A complete 10 system review of systems was obtained and all systems are negative except as noted in the HPI and PMH.   Patient's Health History    Past Medical History:  Diagnosis Date  . Trichomonas vaginitis 05/05/2019    Past Surgical History:  Procedure Laterality Date  . CESAREAN SECTION     x 4  . TUBAL LIGATION      Family History  Problem Relation Age of Onset  . Hypertension Father   . Healthy Mother     Social History   Socioeconomic History  . Marital status: Single    Spouse name: Not on file  . Number of children: Not on file  . Years of education: Not on file  . Highest education level: Not on file  Occupational History  . Not on file  Tobacco Use  . Smoking status: Former Smoker    Types: Cigarettes  . Smokeless tobacco: Never Used  Substance and Sexual Activity  . Alcohol use: No  . Drug use: No  . Sexual activity: Yes    Birth control/protection: Surgical  Other Topics Concern  . Not on file  Social History Narrative  . Not on file   Social Determinants of Health   Financial Resource Strain:   . Difficulty of Paying Living Expenses: Not on file  Food Insecurity:   . Worried About Programme researcher, broadcasting/film/video in the Last Year: Not on file  . Ran Out of Food  in the Last Year: Not on file  Transportation Needs:   . Lack of Transportation (Medical): Not on file  . Lack of Transportation (Non-Medical): Not on file  Physical Activity:   . Days of Exercise per Week: Not on file  . Minutes of Exercise per Session: Not on file  Stress:   . Feeling of Stress : Not on file  Social Connections:   . Frequency of Communication with Friends and Family: Not on file  . Frequency of Social Gatherings with Friends and Family: Not on file  . Attends Religious Services: Not on file  . Active Member of Clubs or Organizations: Not on file  . Attends Banker Meetings: Not on file  . Marital Status: Not on file  Intimate Partner Violence:   . Fear of Current or Ex-Partner: Not on file  . Emotionally Abused: Not on file  . Physically Abused: Not on file  . Sexually Abused: Not on file     Physical Exam   Vitals:   08/15/19 1052  BP: (!) 131/99  Pulse: 65  Resp: 16  Temp: 98.1 F (36.7 C)  SpO2: 97%    CONSTITUTIONAL: Well-appearing, NAD NEURO:  Alert and oriented x 3, no focal  deficits EYES:  eyes equal and reactive ENT/NECK:  no LAD, no JVD; erythema to the posterior oropharynx with mild swelling and exudate to the left tonsil CARDIO: Regular rate, well-perfused, normal S1 and S2 PULM:  CTAB no wheezing or rhonchi GI/GU:  normal bowel sounds, non-distended, non-tender MSK/SPINE:  No gross deformities, no edema SKIN: Faint erythematous papular rash to the right torso PSYCH:  Appropriate speech and behavior  *Additional and/or pertinent findings included in MDM below  Diagnostic and Interventional Summary    EKG Interpretation  Date/Time:    Ventricular Rate:    PR Interval:    QRS Duration:   QT Interval:    QTC Calculation:   R Axis:     Text Interpretation:        Cardiac Monitoring Interpretation:  Labs Reviewed - No data to display  No orders to display    Medications - No data to display   Procedures  /   Critical Care Procedures  ED Course and Medical Decision Making  I have reviewed the triage vital signs, the nursing notes, and pertinent available records from the EMR.  Pertinent labs & imaging results that were available during my care of the patient were reviewed by me and considered in my medical decision making (see below for details).     Suspect either viral pharyngitis with exanthem or strep throat with scarlet fever.  Given the exudate will treat empirically with penicillin.  No significant asymmetry to suggest PTA, no voice change, well-appearing, normal vital signs, appropriate for discharge.    Barth Kirks. Sedonia Small, [place] [email or web address]  Final Clinical Impressions(s) / ED Diagnoses     ICD-10-CM   1. Pharyngitis, unspecified etiology  J02.9   2. Rash  R21     ED Discharge Orders         Ordered    penicillin v potassium (VEETID) 500 MG tablet  4 times daily     08/15/19 1117           Discharge Instructions Discussed with and Provided to Patient:     Discharge Instructions     You were evaluated in the Emergency Department and after careful evaluation, we did not find any emergent condition requiring admission or further testing in the hospital.  Your exam/testing today is overall reassuring.  Your sore throat and rash could be explained by strep throat.  Please take the antibiotics as directed.  Please return to the Emergency Department if you experience any worsening of your condition.  We encourage you to follow up with a primary care provider.  Thank you for allowing [place] to be a part of your care.      Maudie Flakes, MD 08/15/19 1120

## 2019-08-15 NOTE — Discharge Instructions (Addendum)
You were evaluated in the Emergency Department and after careful evaluation, we did not find any emergent condition requiring admission or further testing in the hospital.  Your exam/testing today is overall reassuring.  Your sore throat and rash could be explained by strep throat.  Please take the antibiotics as directed.  Please return to the Emergency Department if you experience any worsening of your condition.  We encourage you to follow up with a primary care provider.  Thank you for allowing [place] to be a part of your care.

## 2019-08-18 ENCOUNTER — Encounter (HOSPITAL_COMMUNITY): Payer: Self-pay

## 2019-08-18 ENCOUNTER — Emergency Department (HOSPITAL_COMMUNITY)
Admission: EM | Admit: 2019-08-18 | Discharge: 2019-08-18 | Disposition: A | Discharge status: Home / Self Care | Payer: Medicaid Other | Attending: Emergency Medicine | Admitting: Emergency Medicine

## 2019-08-18 ENCOUNTER — Other Ambulatory Visit: Payer: Self-pay

## 2019-08-18 DIAGNOSIS — J029 Acute pharyngitis, unspecified: Secondary | ICD-10-CM | POA: Diagnosis present

## 2019-08-18 DIAGNOSIS — Z79899 Other long term (current) drug therapy: Secondary | ICD-10-CM | POA: Diagnosis not present

## 2019-08-18 DIAGNOSIS — Z87891 Personal history of nicotine dependence: Secondary | ICD-10-CM | POA: Insufficient documentation

## 2019-08-18 LAB — GROUP A STREP BY PCR: Group A Strep by PCR: NOT DETECTED

## 2019-08-18 NOTE — Discharge Instructions (Addendum)
Your strep test was negative today. As we discussed it may be negative as you have been on antibiotics for the past 3 days. Continue taking your antibiotics as prescribed.   You declined Mono testing at this time. Your sore throat may be related to a virus as well and should improve over the next couple of days.   Take OTC medications including Ibuprofen and Tylenol as needed for pain. Drink plenty of fluids to stay hydrated.   Please follow up with your PCP. If you do not have one you can follow up with [facility] for primary care needs.

## 2019-08-18 NOTE — ED Provider Notes (Signed)
[facility]-EMERGENCY DEPT Provider Note   CSN: [id number] Arrival date & time: 08/18/19  1935     History Chief Complaint  Patient presents with  . Sore Throat    Jessica Owen is a 34 y.o. female who presents to the ED today for reevaluation.  She reports she was seen in the ED on 2/12 for a sore throat and started on antibiotics.  She states that she initially came to the ED as she noticed white patches to the left side of her throat.  She states that since taking the antibiotic she has noticed white patches to the right side of her throat.  Patient states she is seen but taking antibiotics that the white patches would not spread and she wanted further evaluation.  She states she was not tested for strep and wants to know if she has strep throat.  Patient states that she is not actually having any sore throat -he came to the ED 3 days ago due to white patches.  Denies fevers or chills.  No difficulty swallowing, trismus, voice change.   Per chart review pt was seen in the ED on 2/12 with complaint of 1 week of red rash to right chest as well as a sore throat. There was concern for viral pharyngitis with exanthem vs strep throat with scarlet fever. Empirically treated with penicillin.   Pt reports she noticed the rash about 1 week before she noticed the white patches on her tonsils and does not think the 2 were related.   The history is provided by the patient and medical records.       Past Medical History:  Diagnosis Date  . Trichomonas vaginitis 05/05/2019    Patient Active Problem List   Diagnosis Date Noted  . Viral pharyngitis 11/05/2017    Past Surgical History:  Procedure Laterality Date  . CESAREAN SECTION     x 4  . TUBAL LIGATION       OB History    Gravida  4   Para  4   Term  4   Preterm      AB      Living        SAB      TAB      Ectopic      Multiple      Live Births           Obstetric Comments  Tubal  Ligation 06/20/2010        Family History  Problem Relation Age of Onset  . Hypertension Father   . Healthy Mother     Social History   Tobacco Use  . Smoking status: Former Smoker    Types: Cigarettes  . Smokeless tobacco: Never Used  Substance Use Topics  . Alcohol use: No  . Drug use: No    Home Medications Prior to Admission medications   Medication Sig Start Date End Date Taking? Authorizing Provider  albuterol (PROVENTIL HFA;VENTOLIN HFA) 108 (90 Base) MCG/ACT inhaler Inhale 1-2 puffs into the lungs every 4 (four) hours as needed for wheezing or shortness of breath. Patient not taking: Reported on 05/15/2019 08/06/18   Janne Napoleon, NP  famotidine (PEPCID) 20 MG tablet Take 1 tablet (20 mg total) by mouth 2 (two) times daily. 10/21/16 10/26/16  Tegeler, Canary Brim, MD  fluconazole (DIFLUCAN) 150 MG tablet Take 1 tablet (150 mg) by mouth on day 1. If symptoms are still present in 3 days, take the second  tablet. 07/07/19   Joy, Shawn C, PA-C  metroNIDAZOLE (FLAGYL) 500 MG tablet Take 1 tablet (500 mg total) by mouth 2 (two) times daily. 07/07/19   Joy, Shawn C, PA-C  penicillin v potassium (VEETID) 500 MG tablet Take 1 tablet (500 mg total) by mouth 4 (four) times daily for 7 days. 08/15/19 08/22/19  Sabas Sous, MD  promethazine-dextromethorphan (PROMETHAZINE-DM) 6.25-15 MG/5ML syrup Take 5 mLs by mouth 4 (four) times daily as needed for cough. Patient not taking: Reported on 05/15/2019 08/06/18   Eustace Moore, MD    Allergies    Patient has no known allergies.  Review of Systems   Review of Systems  Constitutional: Negative for chills and fever.  HENT: Negative for sore throat, trouble swallowing and voice change.        + white patches to throat    Physical Exam Updated Vital Signs BP (!) 138/93   Pulse 89   Temp 97.9 F (36.6 C) (Oral)   Resp 18   LMP 08/13/2019   SpO2 98%   Physical Exam Vitals and nursing note reviewed.  Constitutional:       Appearance: She is not ill-appearing.  HENT:     Head: Normocephalic and atraumatic.     Mouth/Throat:     Pharynx: Uvula midline. Posterior oropharyngeal erythema present. No oropharyngeal exudate or uvula swelling.     Comments: Mild posterior oropharyngeal erythema. No exudate appreciated. Uvula is midline. Equal sized tonsils bilaterally.  Eyes:     Conjunctiva/sclera: Conjunctivae normal.  Cardiovascular:     Rate and Rhythm: Normal rate and regular rhythm.  Pulmonary:     Effort: Pulmonary effort is normal.     Breath sounds: Normal breath sounds.  Skin:    General: Skin is warm and dry.     Coloration: Skin is not jaundiced.  Neurological:     Mental Status: She is alert.     ED Results / Procedures / Treatments   Labs (all labs ordered are listed, but only abnormal results are displayed) Labs Reviewed  GROUP A STREP BY PCR    EKG None  Radiology No results found.  Procedures Procedures (including critical care time)  Medications Ordered in ED Medications - No data to display  ED Course  I have reviewed the triage vital signs and the nursing notes.  Pertinent labs & imaging results that were available during my care of the patient were reviewed by me and considered in my medical decision making (see chart for details).  Clinical Course as of Aug 17 2229  Mon Aug 18, 2019  2221 Group A Strep by PCR: NOT DETECTED [MV]    Clinical Course User Index [MV] Tanda Rockers, PA-C   34 year old female who presents the ED requesting reevaluation of her throat.  Was seen 3 days ago and started empirically on penicillin to cover for strep, was not tested.  She says she noticed exudate on her right tonsil which was not there 3 days ago prompted her to come to the ED.  She states she is not actually having a sore throat at this time.  On arrival to the ED she is afebrile, nontachycardic and nontachypneic.  No signs for PTA or other deep space infection today.  Swab for  strep per patient request.  She declines mono testing at this time.  She denies any new rash after starting antibiotics, doubt mono.  Had lengthy discussion with patient regarding fact that her strep test may be  falsely negative given she has been on penicillin for 3 days however she states she wants to know what is causing her sore throat.  I discussed with her that it could very well be strep versus a virus but she is adamant she wants testing.  Will swab for strep.   Strep test negative. Pt advised to continue taking antibiotics as prescribed as test may be falsely negative and do not want her to stop in the middle of a course. Pt advised to follow up with PCP for further evaluation. Strict return precautions discussed. Pt is in agreement with plan at this time and stable for discharge home.   This note was prepared using Dragon voice recognition software and may include unintentional dictation errors due to the inherent limitations of voice recognition software.  MDM Rules/Calculators/A&P                       Final Clinical Impression(s) / ED Diagnoses Final diagnoses:  Sore throat    Rx / DC Orders ED Discharge Orders    None       Discharge Instructions     Your strep test was negative today. As we discussed it may be negative as you have been on antibiotics for the past 3 days. Continue taking your antibiotics as prescribed.   You declined Mono testing at this time. Your sore throat may be related to a virus as well and should improve over the next couple of days.   Take OTC medications including Ibuprofen and Tylenol as needed for pain. Drink plenty of fluids to stay hydrated.   Please follow up with your PCP. If you do not have one you can follow up with [facility] for primary care needs.        Eustaquio Maize, PA-C 08/18/19 [place], Ankit, MD 08/20/19 [phone]

## 2019-08-18 NOTE — ED Triage Notes (Addendum)
Pt c/o white patches inside on both sides of her throat. Pt states she was here 2/12 for sore throat and received antibiotics, but they have not helped. Pt denies pain.

## 2019-09-01 ENCOUNTER — Ambulatory Visit (HOSPITAL_COMMUNITY)
Admission: EM | Admit: 2019-09-01 | Discharge: 2019-09-01 | Disposition: A | Discharge status: Home / Self Care | Payer: Medicaid Other | Attending: Family Medicine | Admitting: Family Medicine

## 2019-09-01 ENCOUNTER — Encounter (HOSPITAL_COMMUNITY): Payer: Self-pay

## 2019-09-01 ENCOUNTER — Other Ambulatory Visit: Payer: Self-pay

## 2019-09-01 DIAGNOSIS — J358 Other chronic diseases of tonsils and adenoids: Secondary | ICD-10-CM | POA: Diagnosis not present

## 2019-09-01 DIAGNOSIS — Z20822 Contact with and (suspected) exposure to covid-19: Secondary | ICD-10-CM | POA: Insufficient documentation

## 2019-09-01 DIAGNOSIS — Z8249 Family history of ischemic heart disease and other diseases of the circulatory system: Secondary | ICD-10-CM | POA: Insufficient documentation

## 2019-09-01 DIAGNOSIS — R21 Rash and other nonspecific skin eruption: Secondary | ICD-10-CM | POA: Insufficient documentation

## 2019-09-01 DIAGNOSIS — Z87891 Personal history of nicotine dependence: Secondary | ICD-10-CM | POA: Diagnosis not present

## 2019-09-01 LAB — POCT RAPID STREP A: Streptococcus, Group A Screen (Direct): NEGATIVE

## 2019-09-01 MED ORDER — CLOTRIMAZOLE-BETAMETHASONE 1-0.05 % EX CREA: TOPICAL_CREAM | CUTANEOUS | 0 refills | Status: DC

## 2019-09-01 NOTE — Discharge Instructions (Addendum)
Please try using lotrisone twice daily on rash consistently for the next 1-2 weeks to cover a fungal rash  Strep negative Swab for oral STD's pending White spots on tonsills may be normal- I would expect this to come and go on its own normally. If developing pain, swelling, swollen lymph nodes, fever associated with this please follow up

## 2019-09-01 NOTE — ED Triage Notes (Signed)
Pt states she was seen at [facility] last month for sore throat and was Rx penicillin.   Pt reports continued discomfort in throat and has observed yellow material in tonsils with nausea the past two days.  Pt states she has had a raised rash to right arm, chest, and abdomen.   Denies fever, chills, cough, abd pain, v/d, or HA.

## 2019-09-02 LAB — CYTOLOGY, (ORAL, ANAL, URETHRAL) ANCILLARY ONLY
Chlamydia: NEGATIVE
Neisseria Gonorrhea: NEGATIVE
Trichomonas: NEGATIVE

## 2019-09-02 NOTE — ED Provider Notes (Signed)
[facility]    CSN: [id number] Arrival date & time: 09/01/19  1811      History   Chief Complaint No chief complaint on file. Rash, Concern about tonsills  HPI Jessica Owen is a 34 y.o. female presenting today for evaluation of rash and evaluation of discoloration of tonsils.  Patient states that earlier today she slightly coughed, this prompted her to look in her throat and noticed some whitish material on her tonsils.  Reports this looks different than when she has had tonsil stones in the past.  She denies associated pain, sore throat.  Denies fevers chills or body aches.  Denies feeling sick/under the weather.  Denies associated rhinorrhea.  Notes that cough today was very mild and has not had a persistent cough.  Denies any nausea vomiting diarrhea or abdominal pain.  She is concerned as she has had these white areas on her tonsils for a few weeks now.  She was previously seen in the emergency room, initiated on penicillin empirically for possible strep, had follow-up 2 days later as she felt these areas were spreading despite being on antibiotics.  At the time she also did not have any associated pain upon chart review.  She is also had a rash that she has noticed to the right axilla as well as right side of her trunk.  Denies associated itching or pain.  Rash is also been present for approximately 1 month.  She has been applying hydrocortisone topically and does feel this has improved, but rash is still present.  Denies history of eczema.  Denies rash on legs or lower left side of body.  Denies history of similar.  HPI  Past Medical History:  Diagnosis Date  . Trichomonas vaginitis 05/05/2019    Patient Active Problem List   Diagnosis Date Noted  . Viral pharyngitis 11/05/2017    Past Surgical History:  Procedure Laterality Date  . CESAREAN SECTION     x 4  . TUBAL LIGATION      OB History    Gravida  4   Para  4   Term  4   Preterm      AB      Living        SAB      TAB      Ectopic      Multiple      Live Births           Obstetric Comments  Tubal Ligation 06/20/2010         Home Medications    Prior to Admission medications   Medication Sig Start Date End Date Taking? Authorizing Provider  clotrimazole-betamethasone (LOTRISONE) cream Apply to affected area 2 times daily prn 09/01/19   Aundrey Elahi C, PA-C  famotidine (PEPCID) 20 MG tablet Take 1 tablet (20 mg total) by mouth 2 (two) times daily. 10/21/16 10/26/16  Tegeler, Gwenyth Allegra, MD  albuterol (PROVENTIL HFA;VENTOLIN HFA) 108 (90 Base) MCG/ACT inhaler Inhale 1-2 puffs into the lungs every 4 (four) hours as needed for wheezing or shortness of breath. Patient not taking: Reported on 05/15/2019 08/06/18 09/01/19  Ashley Murrain, NP    Family History Family History  Problem Relation Age of Onset  . Hypertension Father   . Healthy Mother     Social History Social History   Tobacco Use  . Smoking status: Former Smoker    Types: Cigarettes  . Smokeless tobacco: Never Used  Substance Use Topics  . Alcohol  use: No  . Drug use: No     Allergies   Patient has no known allergies.   Review of Systems Review of Systems  Constitutional: Negative for activity change, appetite change, chills, fatigue and fever.  HENT: Negative for congestion, ear pain, rhinorrhea, sinus pressure, sore throat and trouble swallowing.   Eyes: Negative for discharge and redness.  Respiratory: Negative for cough, chest tightness and shortness of breath.   Cardiovascular: Negative for chest pain.  Gastrointestinal: Negative for abdominal pain, diarrhea, nausea and vomiting.  Musculoskeletal: Negative for myalgias.  Skin: Positive for rash.  Neurological: Negative for dizziness, light-headedness and headaches.     Physical Exam Triage Vital Signs ED Triage Vitals  Enc Vitals Group     BP 09/01/19 1905 118/67     Pulse Rate 09/01/19 1905 79     Resp 09/01/19 1905 20       Temp 09/01/19 1905 99 F (37.2 C)     Temp Source 09/01/19 1905 Oral     SpO2 09/01/19 1905 100 %     Weight --      Height --      Head Circumference --      Peak Flow --      Pain Score 09/01/19 1901 3     Pain Loc --      Pain Edu? --      Excl. in GC? --    No data found.  Updated Vital Signs BP 118/67 (BP Location: Left Arm)   Pulse 79   Temp 99 F (37.2 C) (Oral)   Resp 20   LMP 08/13/2019   SpO2 100%   Visual Acuity Right Eye Distance:   Left Eye Distance:   Bilateral Distance:    Right Eye Near:   Left Eye Near:    Bilateral Near:     Physical Exam Vitals and nursing note reviewed.  Constitutional:      Appearance: She is well-developed.     Comments: No acute distress  HENT:     Head: Normocephalic and atraumatic.     Ears:     Comments: Bilateral ears without tenderness to palpation of external auricle, tragus and mastoid, EAC's without erythema or swelling, TM's with good bony landmarks and cone of light. Non erythematous.    Nose: Nose normal.     Comments: Nasal mucosa pink, no rhinorrhea    Mouth/Throat:     Comments: Bilateral tonsils appear pink with white appearance within groups of tonsils, no erythema, significant enlargement, no soft palate swelling, uvula midline without swelling, posterior pharynx patent and without erythema No change in voice Eyes:     Conjunctiva/sclera: Conjunctivae normal.  Cardiovascular:     Rate and Rhythm: Normal rate.  Pulmonary:     Effort: Pulmonary effort is normal. No respiratory distress.     Comments: Breathing comfortably at rest, CTABL, no wheezing, rales or other adventitious sounds auscultated Speaking in full sentences Abdominal:     General: There is no distension.  Musculoskeletal:        General: Normal range of motion.     Cervical back: Neck supple.  Skin:    General: Skin is warm and dry.     Comments: Faintly papular rash noted around right axilla extending to chest anteriorly, similar  rash noted to lower right flank, no overlying erythema, appears slightly dry  Neurological:     Mental Status: She is alert and oriented to person, place, and time.  UC Treatments / Results  Labs (all labs ordered are listed, but only abnormal results are displayed) Labs Reviewed  NOVEL CORONAVIRUS, NAA (HOSP ORDER, SEND-OUT TO REF LAB; TAT 18-24 HRS)  CULTURE, GROUP A STREP [facility])  POCT RAPID STREP A  CYTOLOGY, (ORAL, ANAL, URETHRAL) ANCILLARY ONLY    EKG   Radiology No results found.  Procedures Procedures (including critical care time)  Medications Ordered in UC Medications - No data to display  Initial Impression / Assessment and Plan / UC Course  I have reviewed the triage vital signs and the nursing notes.  Pertinent labs & imaging results that were available during my care of the patient were reviewed by me and considered in my medical decision making (see chart for details).     Strep test negative, strep culture pending.  Covid swab pending.  Oral cytology for STDs pending.  At this time there are no other associated symptoms along with appearance/white material on tonsils, not suggestive of infectious etiology at this time.  Discussed with patient that this may be normal and may be something that coming and goes, patient expresses continued concern over this not being normal for her.  Declined further antibiotic treatment at this time.  Advised to continue to establish care with PCP, provided ENT contact if needed.  Rash mildly responsive to steroids, persistent for 1 month, does not appear suggestive of allergic or contact dermatitis.  Given near axilla will offer trial of fungal therapy, will provide Lotrisone to apply twice daily.  Discussed strict return precautions. Patient verbalized understanding and is agreeable with plan.  Final Clinical Impressions(s) / UC Diagnoses   Final diagnoses:  Rash and nonspecific skin eruption  Tonsillar exudate      Discharge Instructions     Please try using lotrisone twice daily on rash consistently for the next 1-2 weeks to cover a fungal rash  Strep negative Swab for oral STD's pending White spots on tonsills may be normal- I would expect this to come and go on its own normally. If developing pain, swelling, swollen lymph nodes, fever associated with this please follow up   ED Prescriptions    Medication Sig Dispense Auth. Provider   clotrimazole-betamethasone (LOTRISONE) cream Apply to affected area 2 times daily prn 45 g Aiana Nordquist, [place] C, PA-C     PDMP not reviewed this encounter.   Lew Dawes, [place] 09/02/19 [phone]

## 2019-09-03 ENCOUNTER — Telehealth (HOSPITAL_COMMUNITY): Payer: Self-pay | Admitting: Family Medicine

## 2019-09-03 LAB — CULTURE, GROUP A STREP (THRC)

## 2019-09-03 LAB — NOVEL CORONAVIRUS, NAA (HOSP ORDER, SEND-OUT TO REF LAB; TAT 18-24 HRS): SARS-CoV-2, NAA: NOT DETECTED

## 2019-09-03 MED ORDER — AMOXICILLIN 875 MG PO TABS: 875.0000 mg | ORAL_TABLET | Freq: Two times a day (BID) | ORAL | 0 refills | Status: AC

## 2019-09-03 NOTE — Telephone Encounter (Signed)
Pt calls. Continuing ST. Note from last visit reviewed.  Meds ordered this encounter  Medications  . amoxicillin (AMOXIL) 875 MG tablet    Sig: Take 1 tablet (875 mg total) by mouth 2 (two) times daily for 10 days.    Dispense:  20 tablet    Refill:  0    To f/u if not improving next 48 hours.

## 2019-09-09 ENCOUNTER — Telehealth (HOSPITAL_COMMUNITY): Payer: Self-pay

## 2019-09-09 MED ORDER — FLUCONAZOLE 150 MG PO TABS: 150.0000 mg | ORAL_TABLET | Freq: Every day | ORAL | 0 refills | Status: AC

## 2019-09-09 NOTE — Telephone Encounter (Signed)
Pt called c/o vaginal itching after beginning abx therapy of amoxicillin. Notified Dr. Tracie Harrier of pt status/request. Orders for diflucan 150mg  x1 dose received. Notified pt that Rx will be sent to pharmacy on record. Pt verbalized understanding.

## 2019-09-12 ENCOUNTER — Ambulatory Visit: Payer: Medicaid Other | Admitting: Family Medicine

## 2019-09-23 ENCOUNTER — Other Ambulatory Visit: Payer: Self-pay

## 2019-09-23 ENCOUNTER — Ambulatory Visit (HOSPITAL_BASED_OUTPATIENT_CLINIC_OR_DEPARTMENT_OTHER): Payer: Medicaid Other | Admitting: Internal Medicine

## 2019-10-24 ENCOUNTER — Ambulatory Visit: Payer: Medicaid Other | Admitting: Internal Medicine

## 2019-11-24 ENCOUNTER — Emergency Department (HOSPITAL_COMMUNITY)
Admission: EM | Admit: 2019-11-24 | Discharge: 2019-11-24 | Disposition: A | Discharge status: Home / Self Care | Payer: Medicaid Other | Attending: Emergency Medicine | Admitting: Emergency Medicine

## 2019-11-24 ENCOUNTER — Other Ambulatory Visit: Payer: Self-pay

## 2019-11-24 DIAGNOSIS — R509 Fever, unspecified: Secondary | ICD-10-CM | POA: Insufficient documentation

## 2019-11-24 DIAGNOSIS — Z87891 Personal history of nicotine dependence: Secondary | ICD-10-CM | POA: Diagnosis not present

## 2019-11-24 DIAGNOSIS — J029 Acute pharyngitis, unspecified: Secondary | ICD-10-CM | POA: Diagnosis present

## 2019-11-24 LAB — GROUP A STREP BY PCR: Group A Strep by PCR: NOT DETECTED

## 2019-11-24 MED ORDER — ACETAMINOPHEN 325 MG PO TABS
650.0000 mg | ORAL_TABLET | Freq: Once | ORAL | Status: AC | PRN
  Administered 2019-11-24: 650 mg via ORAL
  Filled 2019-11-24: qty 2

## 2019-11-24 NOTE — ED Provider Notes (Signed)
[facility] EMERGENCY DEPARTMENT Provider Note   CSN: [id number] Arrival date & time: 11/24/19  1754     History Chief Complaint  Patient presents with  . Sore Throat  . Fever    Jessica Owen is a 34 y.o. female who presents to the ED today with complaint of gradual onset, constant, sharp, sore throat that began this morning upon waking up. Pt also complains of chills and feeling "hot." On arrival to the ED pt was noted to have a temp of 103.0. She denies any recent sick contact or COVID 19 positive exposure. Pt states pain with swallowing however still able to drink the water she had with her at bedside. She denies inability to swallow, drooling, voice change, ear pain, sinus pressure, headache, cough, shortness of breath, nausea, vomiting, abdominal pain, or any other associated symptoms.   The history is provided by the patient and medical records.       Past Medical History:  Diagnosis Date  . Trichomonas vaginitis 05/05/2019    Patient Active Problem List   Diagnosis Date Noted  . Viral pharyngitis 11/05/2017    Past Surgical History:  Procedure Laterality Date  . CESAREAN SECTION     x 4  . TUBAL LIGATION       OB History    Gravida  4   Para  4   Term  4   Preterm      AB      Living        SAB      TAB      Ectopic      Multiple      Live Births           Obstetric Comments  Tubal Ligation 06/20/2010        Family History  Problem Relation Age of Onset  . Hypertension Father   . Healthy Mother     Social History   Tobacco Use  . Smoking status: Former Smoker    Types: Cigarettes  . Smokeless tobacco: Never Used  Substance Use Topics  . Alcohol use: No  . Drug use: No    Home Medications Prior to Admission medications   Medication Sig Start Date End Date Taking? Authorizing Provider  clotrimazole-betamethasone (LOTRISONE) cream Apply to affected area 2 times daily prn 09/01/19   Wieters, Hallie C,  PA-C  famotidine (PEPCID) 20 MG tablet Take 1 tablet (20 mg total) by mouth 2 (two) times daily. 10/21/16 10/26/16  Tegeler, Canary Brim, MD  albuterol (PROVENTIL HFA;VENTOLIN HFA) 108 (90 Base) MCG/ACT inhaler Inhale 1-2 puffs into the lungs every 4 (four) hours as needed for wheezing or shortness of breath. Patient not taking: Reported on 05/15/2019 08/06/18 09/01/19  Janne Napoleon, NP    Allergies    Patient has no known allergies.  Review of Systems   Review of Systems  Constitutional: Positive for chills, fatigue and fever.  HENT: Positive for sore throat. Negative for ear pain, sinus pressure, sinus pain, trouble swallowing and voice change.   Respiratory: Negative for cough.   Gastrointestinal: Negative for nausea and vomiting.    Physical Exam Updated Vital Signs BP 131/90 (BP Location: Right Arm)   Pulse (!) 113   Temp (!) 103 F (39.4 C) (Oral)   Resp 16   SpO2 100%   Physical Exam Vitals and nursing note reviewed.  Constitutional:      Appearance: She is not ill-appearing or diaphoretic.  HENT:  Head: Normocephalic and atraumatic.     Right Ear: Tympanic membrane normal.     Left Ear: Tympanic membrane normal.     Mouth/Throat:     Pharynx: Uvula midline.     Tonsils: Tonsillar exudate present. 1+ on the right. 2+ on the left.     Comments: Posterior oropharynx erythematous with bilateral tonsillar hypertrophy (L > R) and exudate on L tonsil. Uvula is midline. No hot potato voice Eyes:     Conjunctiva/sclera: Conjunctivae normal.  Neck:     Comments: Bilateral cervical lymphadenopathy Cardiovascular:     Rate and Rhythm: Normal rate and regular rhythm.     Heart sounds: Normal heart sounds.  Pulmonary:     Effort: Pulmonary effort is normal.     Breath sounds: Normal breath sounds. No wheezing, rhonchi or rales.  Abdominal:     Palpations: Abdomen is soft.     Tenderness: There is no abdominal tenderness.  Musculoskeletal:     Cervical back: Normal range  of motion and neck supple.  Lymphadenopathy:     Cervical: Cervical adenopathy present.  Skin:    General: Skin is warm and dry.  Neurological:     Mental Status: She is alert.     ED Results / Procedures / Treatments   Labs (all labs ordered are listed, but only abnormal results are displayed) Labs Reviewed  GROUP A STREP BY PCR    EKG None  Radiology No results found.  Procedures Procedures (including critical care time)  Medications Ordered in ED Medications  acetaminophen (TYLENOL) tablet 650 mg (650 mg Oral Given 11/24/19 1850)    ED Course  I have reviewed the triage vital signs and the nursing notes.  Pertinent labs & imaging results that were available during my care of the patient were reviewed by me and considered in my medical decision making (see chart for details).  Clinical Course as of Nov 24 2211  Mon Nov 24, 2019  2158 Group A Strep by PCR: NOT DETECTED [MV]    Clinical Course User Index [MV] Tanda Rockers, PA-C   MDM Rules/Calculators/A&P                      34 year old female presenting to the ED today with complaint of sore throat, fatigue, chills, subjective fever that began this morning. On arrival to the ED pt noted to be febrile 103.0 and tachycardic in the 110s. She was given tylenol. On repeat exam when pt was brought back from the waiting room temp 98.7 and HR in the 90s. On exam pt has posterior oropharyngeal erythema and edema with bilateral tonsillar hypertrophy L > R with exudate. Uvula is midline. No hot potato voice. Pt tolerating her own secretions without difficulty and has water at bedside that she has been tolerating without difficulty. No concern for PTA or other deep space infection of the neck. CENTOR score of 4. Will swab for strep at this time.    Strep test negative. Pt to be discharged home at this time with symptomatic treatment including oral fluids and ibuprofen and tylenol. PCP follow up. Strict return precautions  discussed. Pt is in agreement with plan and stable for discharge home.   This note was prepared using Dragon voice recognition software and may include unintentional dictation errors due to the inherent limitations of voice recognition software.  Final Clinical Impression(s) / ED Diagnoses Final diagnoses:  Viral pharyngitis    Rx / DC Orders ED  Discharge Orders    None       Discharge Instructions     Your strep test was negative. Your sore throat is like viral in nature and does not require antibiotics.  Please drink plenty of fluids to stay hydrated. Take Ibuprofen 600 mg every 8 hours and 1,000 mg Tylenol every 8 hours as needed for pain Follow up with your PCP for reeval Return to the ED IMMEDIATELY for any worsening symptoms including worseness sore throat, inability to swallow liquids, drooling on yourself, voice change, or any other new/concerning symptoms       Eustaquio Maize, Hershal Coria 11/24/19 2213    Quintella Reichert, MD 11/25/19 [phone]

## 2019-11-24 NOTE — Discharge Instructions (Signed)
Your strep test was negative. Your sore throat is like viral in nature and does not require antibiotics.  Please drink plenty of fluids to stay hydrated. Take Ibuprofen 600 mg every 8 hours and 1,000 mg Tylenol every 8 hours as needed for pain Follow up with your PCP for reeval Return to the ED IMMEDIATELY for any worsening symptoms including worseness sore throat, inability to swallow liquids, drooling on yourself, voice change, or any other new/concerning symptoms

## 2019-11-24 NOTE — ED Triage Notes (Signed)
States she we up today with generalized body aches and sore throat.

## 2019-11-24 NOTE — ED Notes (Signed)
PAtient called to move to exam room, unable to locate at this time. Called X2

## 2019-12-10 ENCOUNTER — Ambulatory Visit: Payer: Medicaid Other | Attending: Internal Medicine | Admitting: Physician Assistant

## 2019-12-10 DIAGNOSIS — J029 Acute pharyngitis, unspecified: Secondary | ICD-10-CM

## 2019-12-10 DIAGNOSIS — Z09 Encounter for follow-up examination after completed treatment for conditions other than malignant neoplasm: Secondary | ICD-10-CM

## 2019-12-10 DIAGNOSIS — N761 Subacute and chronic vaginitis: Secondary | ICD-10-CM

## 2019-12-10 NOTE — Progress Notes (Signed)
Patient ID: Jessica Owen, female   DOB: [birth year], 34 y.o.   MRN: [id number] Virtual Visit via Telephone Note  I connected with Jessica Owen on 12/10/19 at  2:30 PM EDT by telephone and verified that I am speaking with the correct person using two identifiers.   I discussed the limitations, risks, security and privacy concerns of performing an evaluation and management service by telephone and the availability of in person appointments. I also discussed with the patient that there may be a patient responsible charge related to this service. The patient expressed understanding and agreed to proceed.  PATIENT visit by telephone virtually in the context of Covid-19 pandemic. Patient location:  home My Location:  [facility] office Persons on the call:    Me and the patient.  History of Present Illness: Seen in the ED 11/24/2019 for pharyngitis/tonsillitis and fever.  She has had this multiple times in this past year and wants to see a specialist.  Symptoms currently resolved.    Also has recurring bacterial vaginosis and wants to see gynecologist.  No fever or pelvic pain.    She expresses frustration that she isn't being seen in person   Observations/Objective: NAD.  A&Ox3   Assessment and Plan: 1. Viral pharyngitis Resolved but recurrent - Ambulatory referral to ENT  2. Chronic vaginitis - Ambulatory referral to Gynecology  3. Encounter for examination following treatment at hospital    Follow Up Instructions: Assign PCP in 4-6 weeks-she wants an in-person visit.     I discussed the assessment and treatment plan with the patient. The patient was provided an opportunity to ask questions and all were answered. The patient agreed with the plan and demonstrated an understanding of the instructions.   The patient was advised to call back or seek an in-person evaluation if the symptoms worsen or if the condition fails to improve as anticipated.  I provided 15 minutes of  non-face-to-face time during this encounter.   Georgian Co, PA-C

## 2020-01-23 ENCOUNTER — Other Ambulatory Visit: Payer: Self-pay

## 2020-01-23 ENCOUNTER — Encounter (HOSPITAL_COMMUNITY): Payer: Self-pay

## 2020-01-23 DIAGNOSIS — Z87891 Personal history of nicotine dependence: Secondary | ICD-10-CM | POA: Diagnosis not present

## 2020-01-23 DIAGNOSIS — N760 Acute vaginitis: Secondary | ICD-10-CM | POA: Insufficient documentation

## 2020-01-23 DIAGNOSIS — N898 Other specified noninflammatory disorders of vagina: Secondary | ICD-10-CM | POA: Diagnosis present

## 2020-01-23 MED ORDER — SODIUM CHLORIDE 0.9% FLUSH: 3.0000 mL | Freq: Once | INTRAVENOUS | Status: DC

## 2020-01-23 NOTE — ED Triage Notes (Signed)
Pt reports vaginal itching and irritation. States that a condom broke last week and she noticed urinating some pieces out. She would like a pelvic exam.

## 2020-01-23 NOTE — ED Triage Notes (Deleted)
Pt reports that she took the abortion pill on Tuesday and has felt sick since. Reports that she has been unable to keep any food down. Reports upper abdominal pain and states that her throat is sore from vomiting,  °

## 2020-01-24 ENCOUNTER — Emergency Department (HOSPITAL_COMMUNITY)
Admission: EM | Admit: 2020-01-24 | Discharge: 2020-01-24 | Disposition: A | Discharge status: Home / Self Care | Payer: Medicaid Other | Attending: Emergency Medicine | Admitting: Emergency Medicine

## 2020-01-24 DIAGNOSIS — N760 Acute vaginitis: Secondary | ICD-10-CM

## 2020-01-24 DIAGNOSIS — B9689 Other specified bacterial agents as the cause of diseases classified elsewhere: Secondary | ICD-10-CM

## 2020-01-24 LAB — URINALYSIS, ROUTINE W REFLEX MICROSCOPIC
Bilirubin Urine: NEGATIVE
Glucose, UA: NEGATIVE mg/dL
Hgb urine dipstick: NEGATIVE
Ketones, ur: NEGATIVE mg/dL
Leukocytes,Ua: NEGATIVE
Nitrite: NEGATIVE
Protein, ur: NEGATIVE mg/dL
Specific Gravity, Urine: 1.021 (ref 1.005–1.030)
pH: 7 (ref 5.0–8.0)

## 2020-01-24 LAB — WET PREP, GENITAL
Sperm: NONE SEEN
Trich, Wet Prep: NONE SEEN
Yeast Wet Prep HPF POC: NONE SEEN

## 2020-01-24 LAB — POC URINE PREG, ED: Preg Test, Ur: NEGATIVE

## 2020-01-24 MED ORDER — FLUCONAZOLE 150 MG PO TABS: 150.0000 mg | ORAL_TABLET | Freq: Every day | ORAL | 0 refills | Status: DC

## 2020-01-24 MED ORDER — METRONIDAZOLE 0.75 % VA GEL: 1.0000 | Freq: Two times a day (BID) | VAGINAL | 0 refills | Status: DC

## 2020-01-24 NOTE — Discharge Instructions (Addendum)
Use the Metrogel for vaginitis infection. You can fill the prescription for Diflucan for suspected yeast vaginitis when your week of Metrogel is complete and take for one dose.   Follow up with your doctor as needed.

## 2020-01-24 NOTE — ED Provider Notes (Signed)
[facility]-EMERGENCY DEPT Provider Note   CSN: [id number] Arrival date & time: 01/23/20  2210     History Chief Complaint  Patient presents with  . Vaginal Itching    Jessica Owen is a 34 y.o. female.  Patient to ED for evaluation of possible vaginal foreign body and vaginal itching. She reports having intercourse 2 days ago when the condom broke. She reports passing pieces of condom and is worried about retained pieces. She developed vaginal itching 2 days ago. No fever, vaginal pain.   The history is provided by the patient. No language interpreter was used.       Past Medical History:  Diagnosis Date  . Trichomonas vaginitis 05/05/2019    Patient Active Problem List   Diagnosis Date Noted  . Viral pharyngitis 11/05/2017    Past Surgical History:  Procedure Laterality Date  . CESAREAN SECTION     x 4  . TUBAL LIGATION       OB History    Gravida  4   Para  4   Term  4   Preterm      AB      Living        SAB      TAB      Ectopic      Multiple      Live Births           Obstetric Comments  Tubal Ligation 06/20/2010        Family History  Problem Relation Age of Onset  . Hypertension Father   . Healthy Mother     Social History   Tobacco Use  . Smoking status: Former Smoker    Types: Cigarettes  . Smokeless tobacco: Never Used  Vaping Use  . Vaping Use: Never used  Substance Use Topics  . Alcohol use: No  . Drug use: No    Home Medications Prior to Admission medications   Medication Sig Start Date End Date Taking? Authorizing Provider  clotrimazole-betamethasone (LOTRISONE) cream Apply to affected area 2 times daily prn 09/01/19   Wieters, Hallie C, PA-C  famotidine (PEPCID) 20 MG tablet Take 1 tablet (20 mg total) by mouth 2 (two) times daily. 10/21/16 10/26/16  Tegeler, Canary Brim, MD  albuterol (PROVENTIL HFA;VENTOLIN HFA) 108 (90 Base) MCG/ACT inhaler Inhale 1-2 puffs into the lungs every 4  (four) hours as needed for wheezing or shortness of breath. Patient not taking: Reported on 05/15/2019 08/06/18 09/01/19  Janne Napoleon, NP    Allergies    Patient has no known allergies.  Review of Systems   Review of Systems  Constitutional: Negative for chills and fever.  Gastrointestinal: Negative.  Negative for abdominal pain.  Genitourinary:       See HPI.  Musculoskeletal: Negative.   Skin: Negative.   Neurological: Negative.     Physical Exam Updated Vital Signs BP (!) 161/92 (BP Location: Left Arm)   Pulse 65   Temp 98.3 F (36.8 C) (Oral)   Resp 18   Ht 5\' 11"  (1.803 m)   Wt (!) 104.3 kg   SpO2 99%   BMI 32.08 kg/m   Physical Exam Constitutional:      Appearance: She is well-developed.  Pulmonary:     Effort: Pulmonary effort is normal.  Abdominal:     General: There is no distension.     Tenderness: There is no abdominal tenderness.  Genitourinary:    Comments: No FB visualized on pelvic exam.  No CMT or adnexal tenderness. There is minimal white discharge present in vaginal vault. No cervical discharge.  Musculoskeletal:     Cervical back: Normal range of motion.  Skin:    General: Skin is warm and dry.  Neurological:     Mental Status: She is alert and oriented to person, place, and time.     ED Results / Procedures / Treatments   Labs (all labs ordered are listed, but only abnormal results are displayed) Labs Reviewed  WET PREP, GENITAL - Abnormal; Notable for the following components:      Result Value   Clue Cells Wet Prep HPF POC PRESENT (*)    WBC, Wet Prep HPF POC FEW (*)    All other components within normal limits  URINALYSIS, ROUTINE W REFLEX MICROSCOPIC  POC URINE PREG, ED  GC/CHLAMYDIA PROBE AMP ([place]) NOT AT [facility]    EKG None  Radiology No results found.  Procedures Procedures (including critical care time)  Medications Ordered in ED Medications - No data to display  ED Course  I have reviewed the triage vital  signs and the nursing notes.  Pertinent labs & imaging results that were available during my care of the patient were reviewed by me and considered in my medical decision making (see chart for details).    MDM Rules/Calculators/A&P                          Patient to ED with ss/sxs as per HPI.   NO FB visualized. There is BV present on wet prep. Will treat with metrogel. Will also provide single dose diflucan for vaginal itching, likely yeast vaginitis.    Final Clinical Impression(s) / ED Diagnoses Final diagnoses:  None   1. BV 2. Vaginal itching  Rx / DC Orders ED Discharge Orders    None       Elpidio Anis, PA-C 01/25/20 0626    LongArlyss Repress, MD 01/25/20 [phone]

## 2020-01-26 LAB — GC/CHLAMYDIA PROBE AMP (~~LOC~~) NOT AT ARMC
Chlamydia: NEGATIVE
Comment: NEGATIVE
Comment: NORMAL
Neisseria Gonorrhea: NEGATIVE

## 2020-03-07 IMAGING — DX DG TOE GREAT 2+V*L*
3 series · 3 of 3 positions shown · non-contrast
Comparison: None.

CLINICAL DATA: Injury

EXAM:
LEFT GREAT TOE

[toe ap]
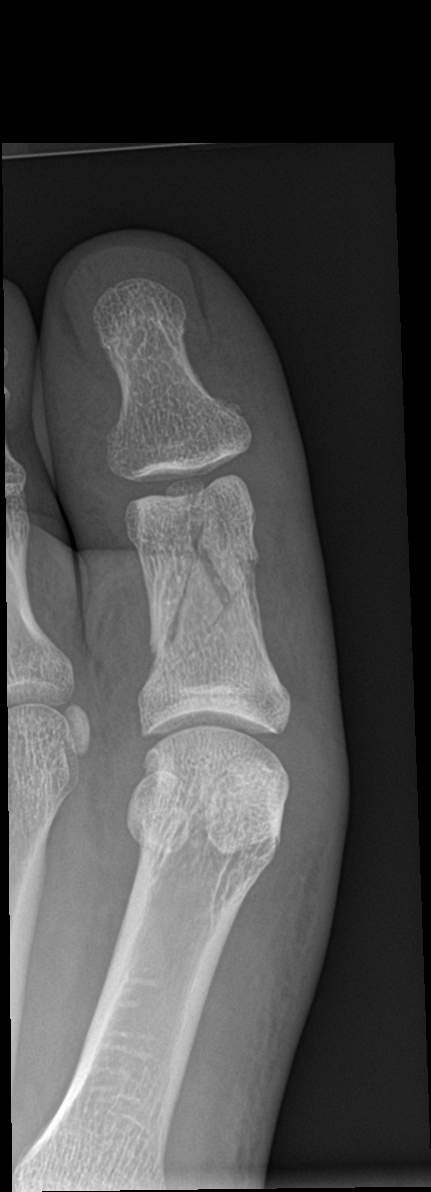

[toe obl]
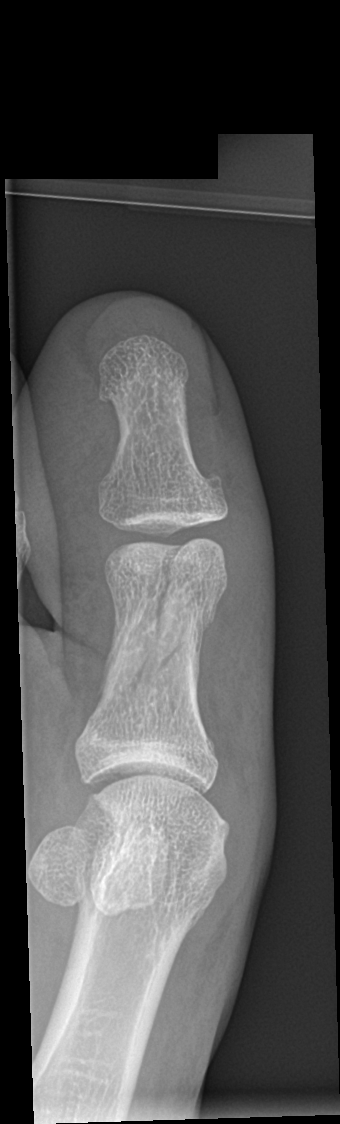

[toe lat]
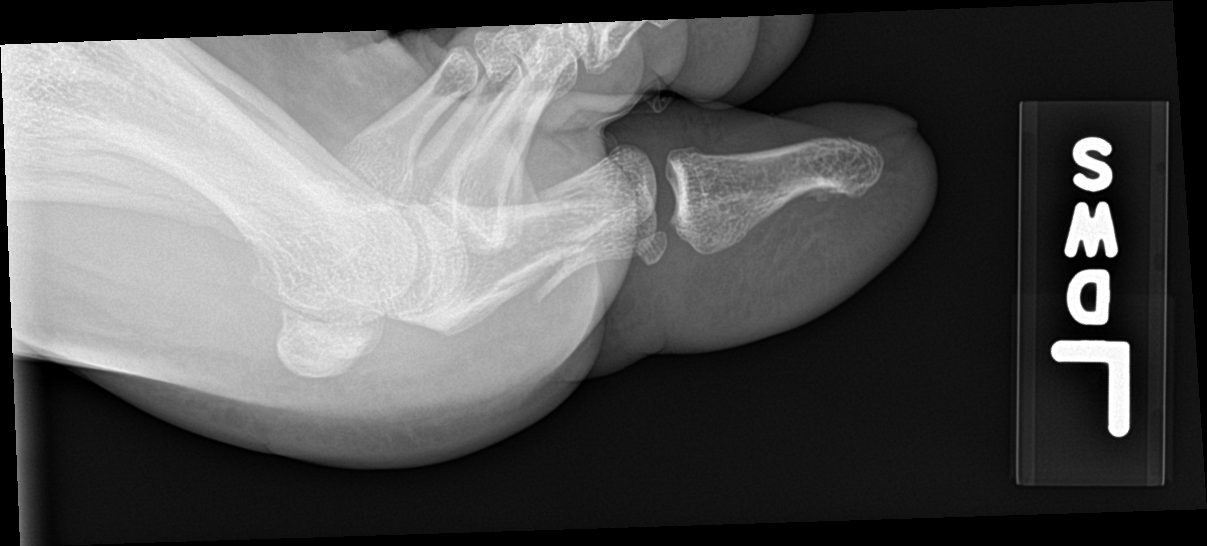

[3 of 3 positions shown; findings below may reference images not displayed]

FINDINGS: Displaced/comminuted fracture involving the mid and distal portions
of the first proximal phalanx. Fracture line extends to the distal
articular surface. Alignment at the first MTP and IP joints remain
normal.
IMPRESSION: Displaced/comminuted fracture of the first proximal phalanx, as
detailed above.

## 2020-03-15 ENCOUNTER — Encounter: Payer: Self-pay | Admitting: Radiology

## 2020-08-05 ENCOUNTER — Other Ambulatory Visit (HOSPITAL_COMMUNITY)
Admission: RE | Admit: 2020-08-05 | Discharge: 2020-08-05 | Disposition: A | Discharge status: Home / Self Care | Payer: Medicaid Other | Source: Ambulatory Visit | Attending: Obstetrics and Gynecology | Admitting: Obstetrics and Gynecology

## 2020-08-05 ENCOUNTER — Ambulatory Visit (INDEPENDENT_AMBULATORY_CARE_PROVIDER_SITE_OTHER): Payer: Medicaid Other | Admitting: Obstetrics and Gynecology

## 2020-08-05 ENCOUNTER — Encounter: Payer: Self-pay | Admitting: Obstetrics and Gynecology

## 2020-08-05 ENCOUNTER — Other Ambulatory Visit: Payer: Self-pay

## 2020-08-05 VITALS — BP 131/81 | HR 82 | Wt 233.0 lb

## 2020-08-05 DIAGNOSIS — Z8742 Personal history of other diseases of the female genital tract: Secondary | ICD-10-CM | POA: Insufficient documentation

## 2020-08-05 NOTE — Progress Notes (Signed)
Obstetrics and Gynecology Visit Return Patient Evaluation  Appointment Date: 08/05/2020  Primary Care Provider: Macie [facility]  Chief Complaint: recurrent BV  History of Present Illness:  Jessica Owen is a 35 y.o. P4 LMP two weeks ago with the above CC  Last swab in the system was on 01/2020 and was negative gc/ct with +CC and WBCs, neg 05/2019 pap but +CC and trich  She states since her last swab with [place] she's had a +swab and treated with some cream.  S/s are typically smell, d/c and some cramping and that they go away with both pills and vaginal tx but then come back. She does not douche or use OTC treatments. She uses tampons and her periods are qmonth, regular. Pt is currently asymptomatic  Review of Systems:  as noted in the History of Present Illness.   Patient Active Problem List   Diagnosis Date Noted  . History of recurrent vaginal discharge 08/05/2020   Medications:  Meztli Llanas had no medications administered during this visit. Current Outpatient Medications  Medication Sig Dispense Refill  . famotidine (PEPCID) 20 MG tablet Take 1 tablet (20 mg total) by mouth 2 (two) times daily. 10 tablet 0   No current facility-administered medications for this visit.    Allergies: has No Known Allergies.  Physical Exam:  BP 131/81   Pulse 82   Wt 233 lb (105.7 kg)   BMI 32.50 kg/m  Body mass index is 32.5 kg/m. General appearance: Well nourished, well developed female in no acute distress.  Abdomen: diffusely non tender to palpation, non distended, and no masses, hernias Neuro/Psych:  Normal mood and affect.    Pelvic exam:  EGBUS normal Vaginal vault: scant, white, non clumpy, non malodorous d/c in vault Cervix: normal Uterus: normal  Assessment: pt stable  Plan:  1. History of recurrent vaginal discharge Follow up swab. If +, even though she's asymptomatic, I told her I'd be leaning towards  boric acid treatment. If negative swab, then I'm leaning to meds with a few refills and if uses more than that then BA treatment   RTC: PRN. Follow up after swab.   Cornelia Copa MD Attending [facility])

## 2020-08-05 NOTE — Progress Notes (Signed)
Recurrent BV

## 2020-08-06 LAB — CERVICOVAGINAL ANCILLARY ONLY
Bacterial Vaginitis (gardnerella): POSITIVE — AB
Candida Glabrata: NEGATIVE
Candida Vaginitis: NEGATIVE
Chlamydia: NEGATIVE
Comment: NEGATIVE
Comment: NEGATIVE
Comment: NEGATIVE
Comment: NEGATIVE
Comment: NEGATIVE
Comment: NORMAL
Neisseria Gonorrhea: NEGATIVE
Trichomonas: NEGATIVE

## 2020-08-09 ENCOUNTER — Telehealth: Payer: Self-pay | Admitting: Obstetrics and Gynecology

## 2020-08-09 MED ORDER — BORIC ACID CRYS: 600.0000 mg | CRYSTALS | Freq: Every day | 5 refills | Status: AC

## 2020-08-09 MED ORDER — METRONIDAZOLE 0.75 % VA GEL: 1.0000 | VAGINAL | 6 refills | Status: AC

## 2020-08-09 MED ORDER — METRONIDAZOLE 500 MG PO TABS: 500.0000 mg | ORAL_TABLET | Freq: Two times a day (BID) | ORAL | 0 refills | Status: DC

## 2020-08-09 NOTE — Telephone Encounter (Signed)
GYN Telephone Note Patient called and d/w her re: results. Since she has so many BV swabs that are positive, I'd recommend po meds then 3 wks of BA and then 4-6 months of metrogel. I told her to keep the BA safe and dangers if consumed by mouth and she understands this. Pt to call us in may or June for a f/u visit in six months  [place] Bing, Montez Hageman MD Attending [facility] (Faculty Practice) 08/09/2020 Time: 1247pm

## 2020-12-29 ENCOUNTER — Emergency Department (HOSPITAL_COMMUNITY)
Admission: EM | Admit: 2020-12-29 | Discharge: 2020-12-29 | Disposition: A | Discharge status: Home / Self Care | Payer: Medicaid Other | Attending: Emergency Medicine | Admitting: Emergency Medicine

## 2020-12-29 ENCOUNTER — Other Ambulatory Visit: Payer: Self-pay

## 2020-12-29 DIAGNOSIS — Z87891 Personal history of nicotine dependence: Secondary | ICD-10-CM | POA: Insufficient documentation

## 2020-12-29 DIAGNOSIS — J029 Acute pharyngitis, unspecified: Secondary | ICD-10-CM | POA: Diagnosis present

## 2020-12-29 DIAGNOSIS — U071 COVID-19: Secondary | ICD-10-CM | POA: Diagnosis not present

## 2020-12-29 MED ORDER — CEFTRIAXONE SODIUM 1 G IJ SOLR
500.0000 mg | Freq: Once | INTRAMUSCULAR | Status: AC
  Administered 2020-12-29: 500 mg via INTRAMUSCULAR
  Filled 2020-12-29: qty 10

## 2020-12-29 MED ORDER — ACETAMINOPHEN 325 MG PO TABS
650.0000 mg | ORAL_TABLET | Freq: Once | ORAL | Status: AC
  Administered 2020-12-29: 650 mg via ORAL
  Filled 2020-12-29: qty 2

## 2020-12-29 MED ORDER — DEXAMETHASONE 4 MG PO TABS: 6.0000 mg | ORAL_TABLET | Freq: Once | ORAL | Status: DC

## 2020-12-29 MED ORDER — LIDOCAINE HCL (PF) 1 % IJ SOLN
2.0000 mL | Freq: Once | INTRAMUSCULAR | Status: AC
  Administered 2020-12-29: 2 mL
  Filled 2020-12-29: qty 30

## 2020-12-29 MED ORDER — AZITHROMYCIN 250 MG PO TABS
1000.0000 mg | ORAL_TABLET | Freq: Once | ORAL | Status: AC
  Administered 2020-12-29: 1000 mg via ORAL
  Filled 2020-12-29: qty 4

## 2020-12-29 NOTE — Discharge Instructions (Addendum)
Your final results will show up on your MyChart app.  Do not have sex until your test have returned negative.  Follow-up with your doctor with any additional testing requirements you may have.  Return back to the ER if you have worsening symptoms difficulty breathing or any additional concerns.

## 2020-12-29 NOTE — ED Provider Notes (Signed)
[facility]-EMERGENCY DEPT Provider Note   CSN: [id number] Arrival date & time: 12/29/20  2133     History Chief Complaint  Patient presents with   Sore Throat    Jessica Owen is a 35 y.o. female.  Patient presents chief complaint of sore throat.  Symptoms been ongoing for 2 days.  Patient states that she had oral sex with somebody 3 days ago and is concerned that she may have contracted the disease.  She has a prior history of STD several years ago.  Denies any vaginal discharge.  Denies headache or fevers or cough or vomiting or diarrhea.      Past Medical History:  Diagnosis Date   Trichomonas vaginitis 05/05/2019   Viral pharyngitis 11/05/2017    Patient Active Problem List   Diagnosis Date Noted   History of recurrent vaginal discharge 08/05/2020    Past Surgical History:  Procedure Laterality Date   CESAREAN SECTION     x 4   TUBAL LIGATION       OB History     Gravida  4   Para  4   Term  4   Preterm      AB      Living         SAB      IAB      Ectopic      Multiple      Live Births           Obstetric Comments  Tubal Ligation 06/20/2010         Family History  Problem Relation Age of Onset   Hypertension Father    Healthy Mother     Social History   Tobacco Use   Smoking status: Former    Pack years: 0.00    Types: Cigarettes   Smokeless tobacco: Never  Vaping Use   Vaping Use: Never used  Substance Use Topics   Alcohol use: No   Drug use: No    Home Medications Prior to Admission medications   Medication Sig Start Date End Date Taking? Authorizing Provider  clotrimazole-betamethasone (LOTRISONE) cream Apply to affected area 2 times daily prn Patient not taking: Reported on 08/05/2020 09/01/19   Wieters, Hallie C, PA-C  famotidine (PEPCID) 20 MG tablet Take 1 tablet (20 mg total) by mouth 2 (two) times daily. 10/21/16 10/26/16  Tegeler, Canary Brim, MD  fluconazole (DIFLUCAN) 150 MG tablet  Take 1 tablet (150 mg total) by mouth daily. Take after you complete the antibiotic for vaginal infection Patient not taking: Reported on 08/05/2020 01/24/20   Elpidio Anis, PA-C  metroNIDAZOLE (FLAGYL) 500 MG tablet Take 1 tablet (500 mg total) by mouth 2 (two) times daily. Take this first. 08/09/20   [place] Bing, MD  albuterol (PROVENTIL HFA;VENTOLIN HFA) 108 (90 Base) MCG/ACT inhaler Inhale 1-2 puffs into the lungs every 4 (four) hours as needed for wheezing or shortness of breath. Patient not taking: Reported on 05/15/2019 08/06/18 09/01/19  Janne Napoleon, NP    Allergies    Patient has no known allergies.  Review of Systems   Review of Systems  Constitutional:  Negative for fever.  HENT:  Negative for ear pain.   Eyes:  Negative for pain.  Respiratory:  Negative for cough.   Cardiovascular:  Negative for chest pain.  Gastrointestinal:  Negative for abdominal pain.  Genitourinary:  Negative for flank pain.  Musculoskeletal:  Negative for back pain.  Skin:  Negative for rash.  Neurological:  Negative for headaches.   Physical Exam Updated Vital Signs BP (!) 141/92 (BP Location: Left Arm)   Pulse 81   Temp 98 F (36.7 C) (Oral)   Resp 15   Ht 5\' 11"  (1.803 m)   Wt 108.9 kg   LMP 12/22/2020 (Approximate)   SpO2 98%   BMI 33.47 kg/m   Physical Exam Constitutional:      General: She is not in acute distress.    Appearance: Normal appearance.  HENT:     Head: Normocephalic.     Nose: Nose normal.     Mouth/Throat:     Comments: Posterior pharynx appears clear.  No exudates seen no erythema noted.  No displacement of the uvula noted. Eyes:     Extraocular Movements: Extraocular movements intact.  Cardiovascular:     Rate and Rhythm: Normal rate.  Pulmonary:     Effort: Pulmonary effort is normal.  Musculoskeletal:        General: Normal range of motion.     Cervical back: Normal range of motion.  Lymphadenopathy:     Cervical: No cervical adenopathy.  Neurological:      General: No focal deficit present.     Mental Status: She is alert. Mental status is at baseline.    ED Results / Procedures / Treatments   Labs (all labs ordered are listed, but only abnormal results are displayed) Labs Reviewed  GROUP A STREP BY PCR  RESP PANEL BY RT-PCR (FLU A&B, COVID) [id number]  GC/CHLAMYDIA PROBE AMP ([place]) NOT AT [facility]    EKG None  Radiology No results found.  Procedures Procedures   Medications Ordered in ED Medications  acetaminophen (TYLENOL) tablet 650 mg (has no administration in time range)  cefTRIAXone (ROCEPHIN) injection 500 mg (has no administration in time range)  azithromycin (ZITHROMAX) tablet 1,000 mg (has no administration in time range)    ED Course  I have reviewed the triage vital signs and the nursing notes.  Pertinent labs & imaging results that were available during my care of the patient were reviewed by me and considered in my medical decision making (see chart for details).    MDM Rules/Calculators/A&P                          Clinical exam appears normal no exudate no signs of infection noted.  Given the patient's history empirical treatment provided.  Swabs for gonorrhea chlamydia strep throat ordered and pending results.  Patient advised follow-up with her doctor within the week.  Advising immediate return for worsening symptoms or any additional concerns.  Final Clinical Impression(s) / ED Diagnoses Final diagnoses:  Pharyngitis, unspecified etiology    Rx / DC Orders ED Discharge Orders     None        [facility], MD 12/29/20 2239

## 2020-12-29 NOTE — ED Triage Notes (Signed)
Pt reports sore throat for 2 days and spitting up mucous.  Pt denies fevers/chills/cough.  Denies n/v/d.

## 2020-12-30 LAB — RESP PANEL BY RT-PCR (FLU A&B, COVID) ARPGX2
Influenza A by PCR: NEGATIVE
Influenza B by PCR: NEGATIVE
SARS Coronavirus 2 by RT PCR: POSITIVE — AB

## 2020-12-30 LAB — GROUP A STREP BY PCR: Group A Strep by PCR: NOT DETECTED

## 2021-02-01 IMAGING — DX DG CHEST 2V
2 series · 2 of 2 positions shown · non-contrast
Comparison: None.

CLINICAL DATA: Cough and fever with wheezing

EXAM:
CHEST - 2 VIEW

[chest pa]
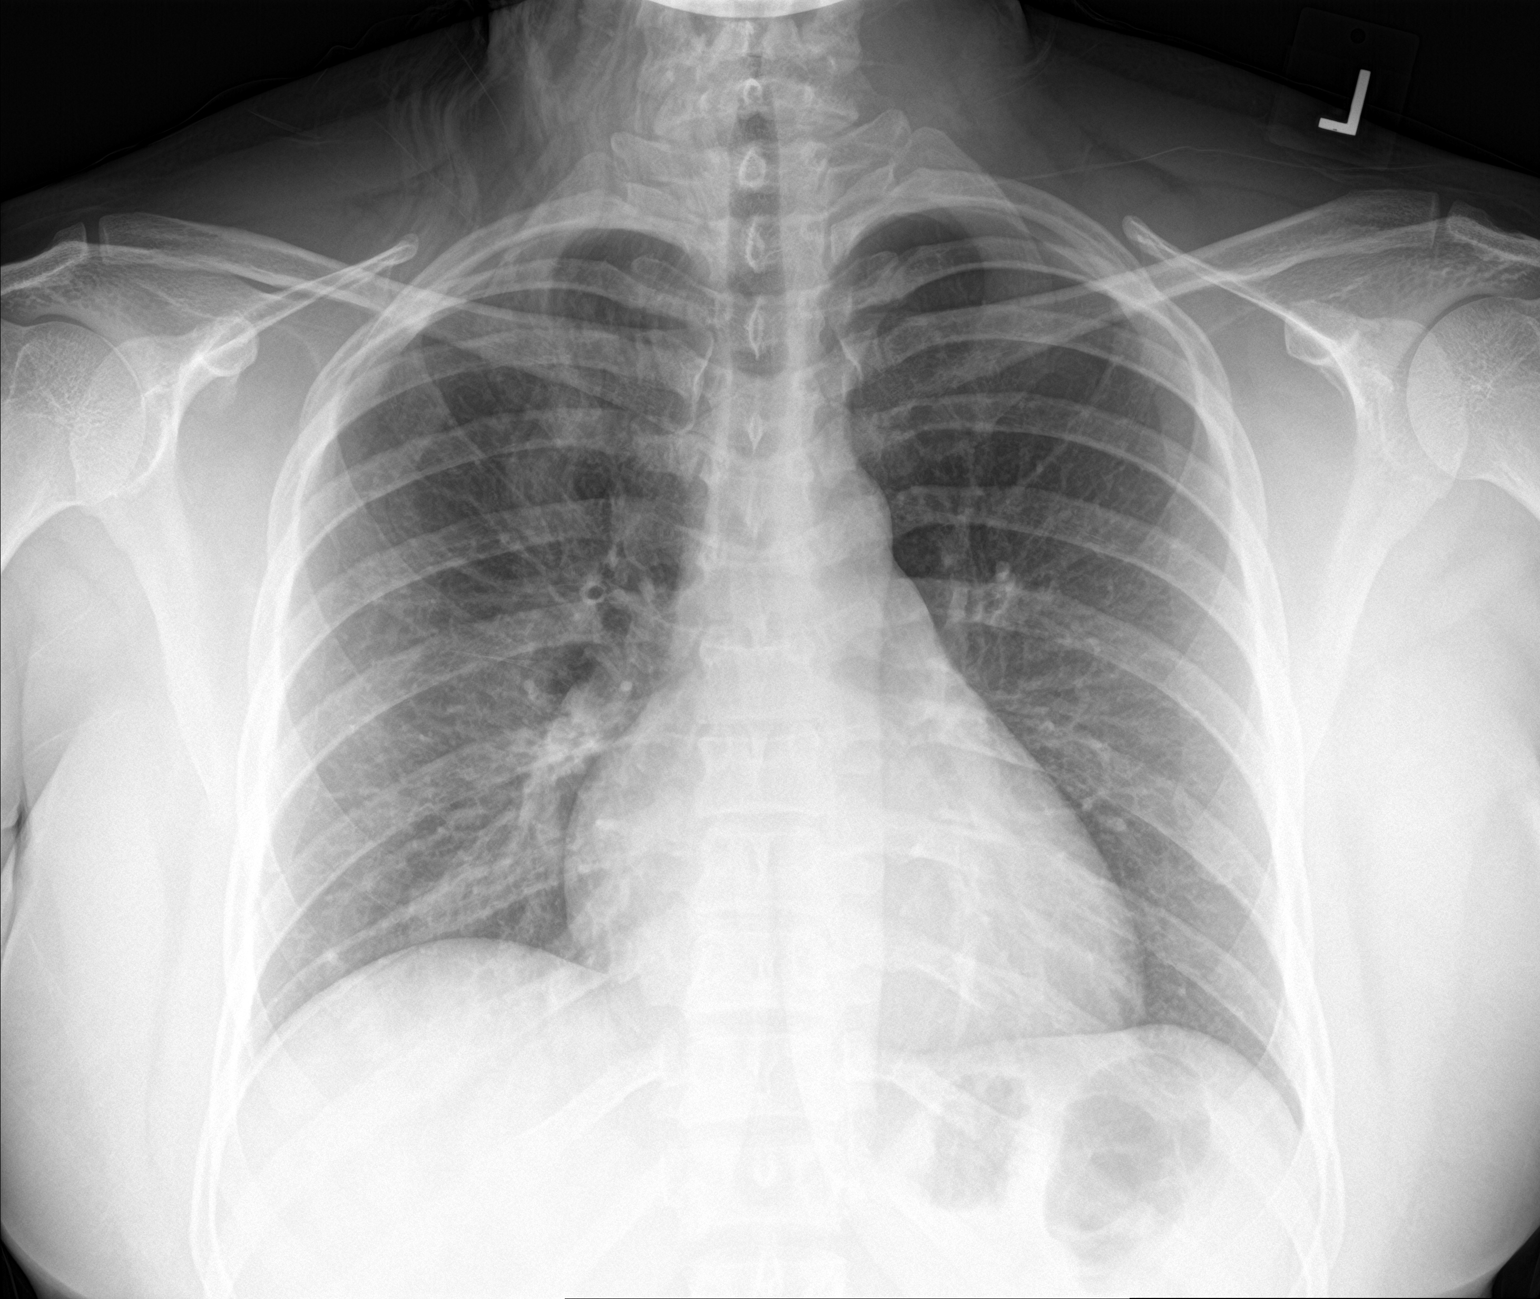

[chest lat]
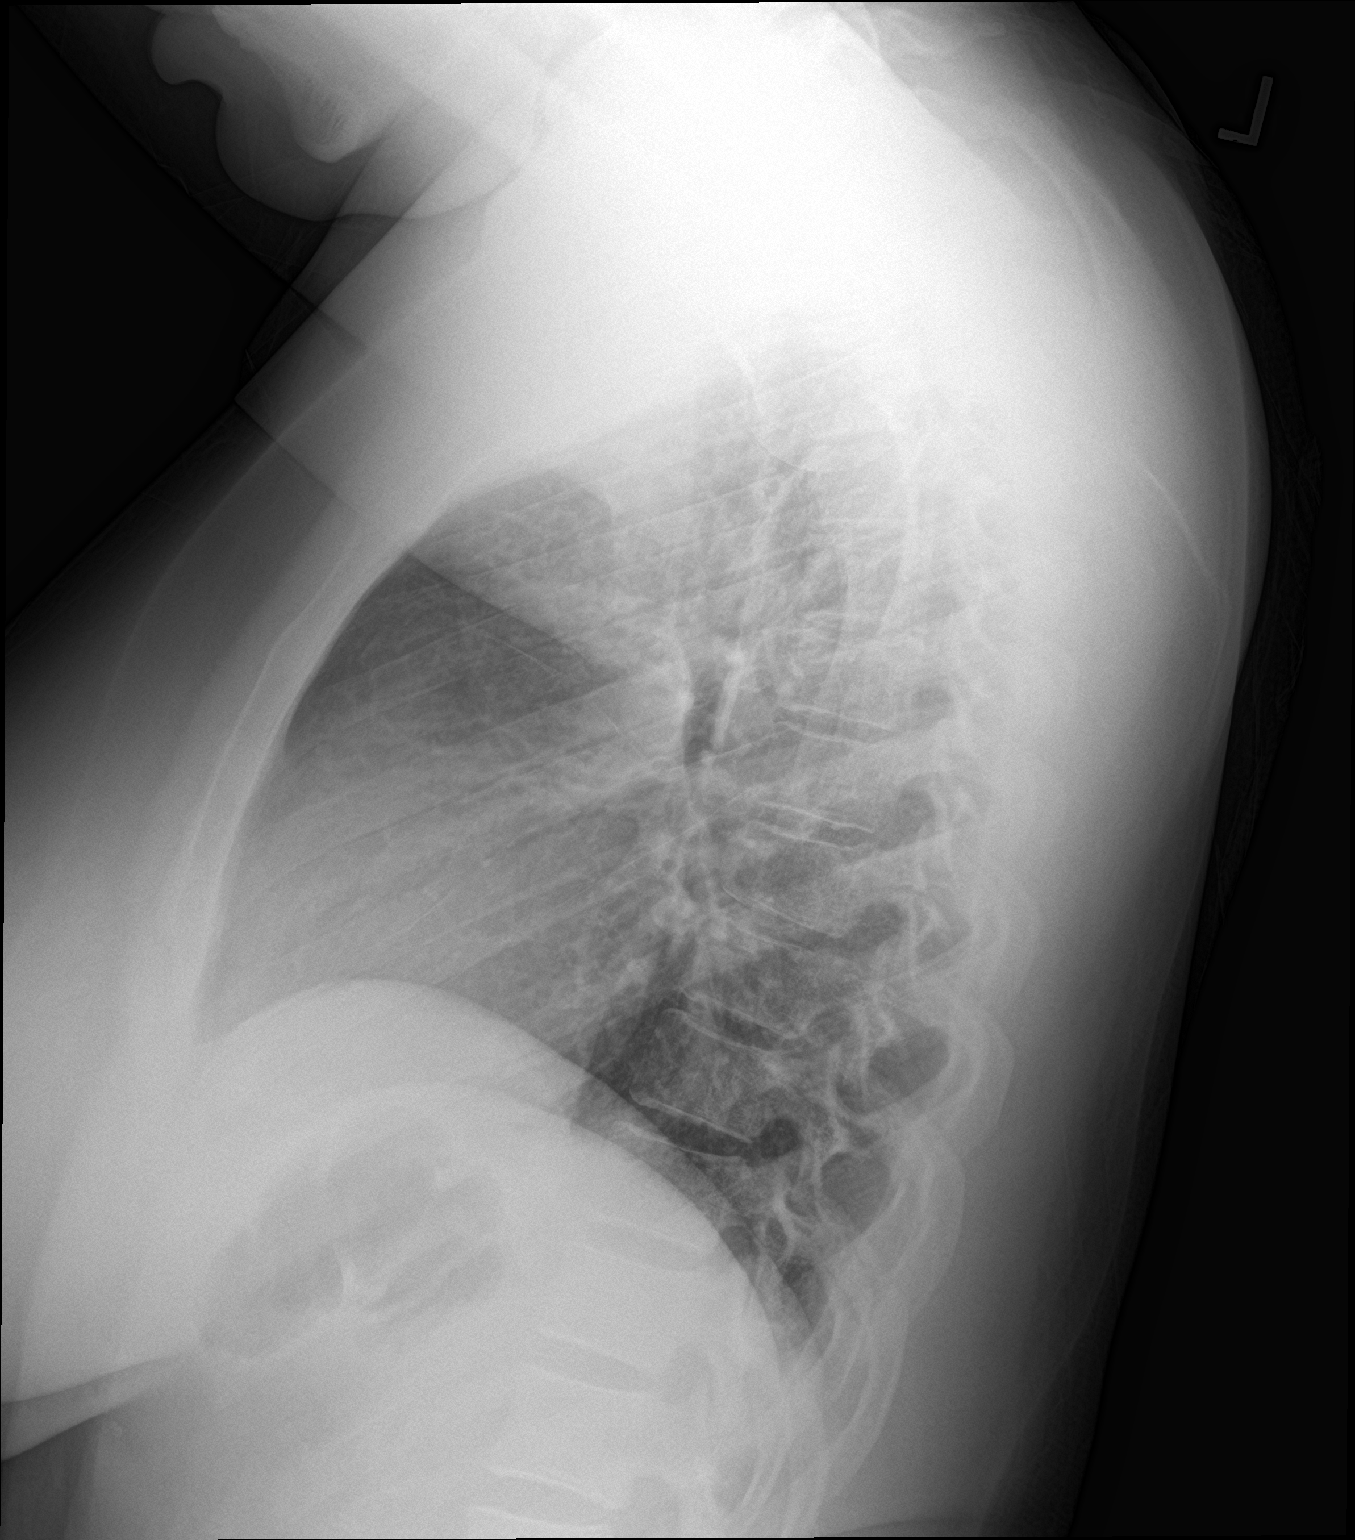

[2 of 2 positions shown; findings below may reference images not displayed]

FINDINGS: Lungs are clear. Heart size and pulmonary vascularity are normal. No
adenopathy. No pneumothorax. No bone lesions.
IMPRESSION: No edema or consolidation.

## 2021-04-06 NOTE — Progress Notes (Signed)
Open in error

## 2021-09-25 ENCOUNTER — Encounter (HOSPITAL_COMMUNITY): Payer: Self-pay

## 2021-09-25 ENCOUNTER — Ambulatory Visit (HOSPITAL_COMMUNITY)
Admission: EM | Admit: 2021-09-25 | Discharge: 2021-09-25 | Disposition: A | Discharge status: Home / Self Care | Payer: Medicaid Other | Attending: Family Medicine | Admitting: Family Medicine

## 2021-09-25 ENCOUNTER — Other Ambulatory Visit: Payer: Self-pay

## 2021-09-25 DIAGNOSIS — J029 Acute pharyngitis, unspecified: Secondary | ICD-10-CM | POA: Diagnosis not present

## 2021-09-25 DIAGNOSIS — J039 Acute tonsillitis, unspecified: Secondary | ICD-10-CM | POA: Insufficient documentation

## 2021-09-25 LAB — POCT RAPID STREP A, ED / UC: Streptococcus, Group A Screen (Direct): NEGATIVE

## 2021-09-25 MED ORDER — PREDNISONE 20 MG PO TABS: 20.0000 mg | ORAL_TABLET | Freq: Every day | ORAL | 0 refills | Status: AC

## 2021-09-25 NOTE — Discharge Instructions (Addendum)
Please follow-up with provided contact information for ear, nose, throat specialist for further evaluation and management.  Your rapid strep was negative.  Throat culture is pending.  You have been prescribed a short course of prednisone to help alleviate inflammation in the tonsils.  No need for antibiotics at this time. ?

## 2021-09-25 NOTE — ED Provider Notes (Signed)
?[facility] ? ? ? ?CSN: [id number] ?Arrival date & time: 09/25/21  1647 ? ? ?  ? ?History   ?Chief Complaint ?Chief Complaint  ?Patient presents with  ? Sore Throat  ? ? ?HPI ?Jessica Owen is a 36 y.o. female.  ? ?Patient presents with concerns for sore throat and swollen tonsils.  Patient reports that she has been dealing with these symptoms intermittently over the past year.  She saw ear, nose, and throat doctor when symptoms first started but reports that they "did not do anything for her".  She has not seen a doctor since seeing ENT.  Patient reports that her left tonsil has become more swollen over the past week and sore throat has not really changed. She is mainly concerned due to swollen tonsil. Denies any associated upper respiratory symptoms, fever, sick contacts. ? ? ?Sore Throat ? ? ?Past Medical History:  ?Diagnosis Date  ? Trichomonas vaginitis 05/05/2019  ? Viral pharyngitis 11/05/2017  ? ? ?Patient Active Problem List  ? Diagnosis Date Noted  ? History of recurrent vaginal discharge 08/05/2020  ? ? ?Past Surgical History:  ?Procedure Laterality Date  ? CESAREAN SECTION    ? x 4  ? TUBAL LIGATION    ? ? ?OB History   ? ? Gravida  ?4  ? Para  ?4  ? Term  ?4  ? Preterm  ?   ? AB  ?   ? Living  ?   ?  ? ? SAB  ?   ? IAB  ?   ? Ectopic  ?   ? Multiple  ?   ? Live Births  ?   ?   ?  ? Obstetric Comments  ?Tubal Ligation 06/20/2010  ?  ? ?  ? ? ? ?Home Medications   ? ?Prior to Admission medications   ?Medication Sig Start Date End Date Taking? Authorizing Provider  ?predniSONE (DELTASONE) 20 MG tablet Take 1 tablet (20 mg total) by mouth daily for 5 days. 09/25/21 09/30/21 Yes Gustavus Bryant, FNP  ?clotrimazole-betamethasone (LOTRISONE) cream Apply to affected area 2 times daily prn ?Patient not taking: Reported on 08/05/2020 09/01/19   Wieters, Fran Lowes C, PA-C  ?famotidine (PEPCID) 20 MG tablet Take 1 tablet (20 mg total) by mouth 2 (two) times daily. 10/21/16 10/26/16  Tegeler, Canary Brim, MD   ?fluconazole (DIFLUCAN) 150 MG tablet Take 1 tablet (150 mg total) by mouth daily. Take after you complete the antibiotic for vaginal infection ?Patient not taking: Reported on 08/05/2020 01/24/20   Elpidio Anis, PA-C  ?metroNIDAZOLE (FLAGYL) 500 MG tablet Take 1 tablet (500 mg total) by mouth 2 (two) times daily. Take this first. 08/09/20   [place] Bing, MD  ?albuterol (PROVENTIL HFA;VENTOLIN HFA) 108 (90 Base) MCG/ACT inhaler Inhale 1-2 puffs into the lungs every 4 (four) hours as needed for wheezing or shortness of breath. ?Patient not taking: Reported on 05/15/2019 08/06/18 09/01/19  Janne Napoleon, NP  ? ? ?Family History ?Family History  ?Problem Relation Age of Onset  ? Hypertension Father   ? Healthy Mother   ? ? ?Social History ?Social History  ? ?Tobacco Use  ? Smoking status: Former  ?  Types: Cigarettes  ? Smokeless tobacco: Never  ?Vaping Use  ? Vaping Use: Never used  ?Substance Use Topics  ? Alcohol use: No  ? Drug use: No  ? ? ? ?Allergies   ?Patient has no known allergies. ? ? ?Review of Systems ?Review of  Systems ?Per HPI ? ?Physical Exam ?Triage Vital Signs ?ED Triage Vitals  ?Enc Vitals Group  ?   BP 09/25/21 1757 (!) 134/93  ?   Pulse Rate 09/25/21 1757 70  ?   Resp 09/25/21 1757 19  ?   Temp 09/25/21 1757 98.3 ?F (36.8 ?C)  ?   Temp Source 09/25/21 1757 Oral  ?   SpO2 09/25/21 1757 100 %  ?   Weight --   ?   Height --   ?   Head Circumference --   ?   Peak Flow --   ?   Pain Score 09/25/21 1756 10  ?   Pain Loc --   ?   Pain Edu? --   ?   Excl. in GC? --   ? ?No data found. ? ?Updated Vital Signs ?BP (!) 134/93 (BP Location: Right Arm)   Pulse 70   Temp 98.3 ?F (36.8 ?C) (Oral)   Resp 19   LMP 09/24/2021 (Exact Date)   SpO2 100%  ? ?Visual Acuity ?Right Eye Distance:   ?Left Eye Distance:   ?Bilateral Distance:   ? ?Right Eye Near:   ?Left Eye Near:    ?Bilateral Near:    ? ?Physical Exam ?Constitutional:   ?   General: She is not in acute distress. ?   Appearance: Normal appearance. She is  not toxic-appearing or diaphoretic.  ?HENT:  ?   Head: Normocephalic and atraumatic.  ?   Mouth/Throat:  ?   Pharynx: No pharyngeal swelling, oropharyngeal exudate or posterior oropharyngeal erythema.  ?   Tonsils: No tonsillar exudate or tonsillar abscesses. 1+ on the left.  ?Eyes:  ?   Extraocular Movements: Extraocular movements intact.  ?   Conjunctiva/sclera: Conjunctivae normal.  ?Cardiovascular:  ?   Rate and Rhythm: Normal rate and regular rhythm.  ?   Pulses: Normal pulses.  ?   Heart sounds: Normal heart sounds.  ?Pulmonary:  ?   Effort: Pulmonary effort is normal. No respiratory distress.  ?   Breath sounds: Normal breath sounds.  ?Neurological:  ?   General: No focal deficit present.  ?   Mental Status: She is alert and oriented to person, place, and time. Mental status is at baseline.  ?Psychiatric:     ?   Mood and Affect: Mood normal.     ?   Behavior: Behavior normal.     ?   Thought Content: Thought content normal.     ?   Judgment: Judgment normal.  ? ? ? ?UC Treatments / Results  ?Labs ?(all labs ordered are listed, but only abnormal results are displayed) ?Labs Reviewed  ?CULTURE, GROUP A STREP [facility]  ?POCT RAPID STREP A, ED / UC  ? ? ?EKG ? ? ?Radiology ?No results found. ? ?Procedures ?Procedures (including critical care time) ? ?Medications Ordered in UC ?Medications - No data to display ? ?Initial Impression / Assessment and Plan / UC Course  ?I have reviewed the triage vital signs and the nursing notes. ? ?Pertinent labs & imaging results that were available during my care of the patient were reviewed by me and considered in my medical decision making (see chart for details). ? ?  ? ?Left tonsil is mildly swollen but not erythematous and no exudate present.  No tonsil stones present.  Rapid strep was negative.  Throat culture is pending.  No concern for bacterial infection.  Suspect possible viral infection that is causing swollen tonsil over the  past week.  Will prescribe low-dose and  short course of prednisone steroid to help alleviate inflammation in tonsil.  No signs of peritonsillar abscess on exam.  Due to duration of symptoms, patient was advised that she will need to follow-up with ENT specialty again for further evaluation and management.  She was provided with contact information.  Discussed return precautions.  Patient verbalized understanding and was agreeable with plan. ?Final Clinical Impressions(s) / UC Diagnoses  ? ?Final diagnoses:  ?Tonsillitis  ?Sore throat  ? ? ? ?Discharge Instructions   ? ?  ?Please follow-up with provided contact information for ear, nose, throat specialist for further evaluation and management.  Your rapid strep was negative.  Throat culture is pending.  You have been prescribed a short course of prednisone to help alleviate inflammation in the tonsils.  No need for antibiotics at this time. ? ? ? ?ED Prescriptions   ? ? Medication Sig Dispense Auth. Provider  ? predniSONE (DELTASONE) 20 MG tablet Take 1 tablet (20 mg total) by mouth daily for 5 days. 5 tablet Ervin Knack E, [place]  ? ?  ? ?PDMP not reviewed this encounter. ?  ?Gustavus Bryant, [place] ?09/25/21 1839 ? ?

## 2021-09-25 NOTE — ED Triage Notes (Signed)
Pt presents with c/o swollen tonsils x 1 week. Pt states she has a hx of tonsillitis.  ? ? ?

## 2021-09-26 LAB — CULTURE, GROUP A STREP (THRC)

## 2022-02-19 ENCOUNTER — Emergency Department (HOSPITAL_COMMUNITY)
Admission: EM | Admit: 2022-02-19 | Discharge: 2022-02-20 | Disposition: A | Discharge status: Home / Self Care | Payer: Medicaid Other | Attending: Emergency Medicine | Admitting: Emergency Medicine

## 2022-02-19 ENCOUNTER — Emergency Department (HOSPITAL_COMMUNITY): Payer: Medicaid Other

## 2022-02-19 ENCOUNTER — Other Ambulatory Visit: Payer: Self-pay

## 2022-02-19 DIAGNOSIS — S025XXA Fracture of tooth (traumatic), initial encounter for closed fracture: Secondary | ICD-10-CM | POA: Insufficient documentation

## 2022-02-19 DIAGNOSIS — S0990XA Unspecified injury of head, initial encounter: Secondary | ICD-10-CM | POA: Diagnosis present

## 2022-02-19 DIAGNOSIS — S022XXA Fracture of nasal bones, initial encounter for closed fracture: Secondary | ICD-10-CM

## 2022-02-19 LAB — BASIC METABOLIC PANEL
Anion gap: 9 (ref 5–15)
BUN: 6 mg/dL (ref 6–20)
CO2: 23 mmol/L (ref 22–32)
Calcium: 9.5 mg/dL (ref 8.9–10.3)
Chloride: 104 mmol/L (ref 98–111)
Creatinine, Ser: 0.86 mg/dL (ref 0.44–1.00)
GFR, Estimated: >60 mL/min (ref >60)
Glucose, Bld: 98 mg/dL (ref 70–99)
Potassium: 3.9 mmol/L (ref 3.5–5.1)
Sodium: 136 mmol/L (ref 135–145)

## 2022-02-19 LAB — CBC
HCT: 38.7 % (ref 36.0–46.0)
Hemoglobin: 11.2 g/dL — ABNORMAL LOW (ref 12.0–15.0)
MCH: 22.3 pg — ABNORMAL LOW (ref 26.0–34.0)
MCHC: 28.9 g/dL — ABNORMAL LOW (ref 30.0–36.0)
MCV: 76.9 fL — ABNORMAL LOW (ref 80.0–100.0)
Platelets: 341 10*3/uL (ref 150–400)
RBC: 5.03 MIL/uL (ref 3.87–5.11)
RDW: 15.2 % (ref 11.5–15.5)
WBC: 7.5 10*3/uL (ref 4.0–10.5)
nRBC: 0 % (ref 0.0–0.2)
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
## 2022-02-20 MED ORDER — IBUPROFEN 400 MG PO TABS
600.0000 mg | ORAL_TABLET | Freq: Once | ORAL | Status: AC
  Administered 2022-02-20: 600 mg via ORAL
  Filled 2022-02-20: qty 1

## 2022-02-20 MED ORDER — OXYCODONE HCL 5 MG PO TABS: 5.0000 mg | ORAL_TABLET | ORAL | 0 refills | Status: DC | PRN

## 2022-02-20 MED ORDER — OXYCODONE-ACETAMINOPHEN 5-325 MG PO TABS
1.0000 | ORAL_TABLET | Freq: Once | ORAL | Status: AC
  Administered 2022-02-20: 1 via ORAL
  Filled 2022-02-20: qty 1

## 2022-02-20 NOTE — ED Notes (Signed)
Pt refusing to have vitals rechecked in the lobby!
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
## 2023-10-15 ENCOUNTER — Ambulatory Visit: Payer: Medicaid Other | Admitting: Obstetrics and Gynecology

## 2023-10-15 ENCOUNTER — Encounter: Payer: Self-pay | Admitting: Obstetrics and Gynecology

## 2023-10-15 ENCOUNTER — Other Ambulatory Visit (HOSPITAL_COMMUNITY)
Admission: RE | Admit: 2023-10-15 | Discharge: 2023-10-15 | Disposition: A | Discharge status: Home / Self Care | Source: Ambulatory Visit | Attending: Obstetrics and Gynecology | Admitting: Obstetrics and Gynecology

## 2023-10-15 ENCOUNTER — Other Ambulatory Visit: Payer: Self-pay

## 2023-10-15 VITALS — BP 116/82 | HR 76 | Ht 69.0 in | Wt 247.9 lb

## 2023-10-15 DIAGNOSIS — Z113 Encounter for screening for infections with a predominantly sexual mode of transmission: Secondary | ICD-10-CM | POA: Insufficient documentation

## 2023-10-15 DIAGNOSIS — Z8619 Personal history of other infectious and parasitic diseases: Secondary | ICD-10-CM | POA: Diagnosis not present

## 2023-10-15 DIAGNOSIS — Z01419 Encounter for gynecological examination (general) (routine) without abnormal findings: Secondary | ICD-10-CM | POA: Diagnosis present

## 2023-10-15 DIAGNOSIS — Z1331 Encounter for screening for depression: Secondary | ICD-10-CM | POA: Diagnosis not present

## 2023-10-15 NOTE — Progress Notes (Signed)
 Obstetrics and Gynecology Annual Patient Evaluation  Appointment Date: 10/15/2023  OBGYN Clinic: [facility]  Primary Care Provider: None  Referring Provider: self  Chief Complaint:  Chief Complaint  Patient presents with   Gynecologic Exam    History of Present Illness: Jessica Owen is a 38 y.o.  [id number] (Patient's last menstrual period was 10/08/2023 (approximate).), seen for the above chief complaint. Her past medical history is significant for h/o c-sections and BTL   No issues or concerns.   Review of Systems: Pertinent items are noted in HPI.   Past Medical History:  Past Medical History:  Diagnosis Date   Trichomonas vaginitis 05/05/2019   Viral pharyngitis 11/05/2017    Past Surgical History:  Past Surgical History:  Procedure Laterality Date   CESAREAN SECTION     x 4   TUBAL LIGATION      Past Obstetrical History:  OB History  Gravida Para Term Preterm AB Living  4 4 4  0 0 4  SAB IAB Ectopic Multiple Live Births          # Outcome Date GA Lbr Len/2nd Weight Sex Type Anes PTL Lv  4 Term 06/20/10    F CS-LTranv     3 Term 01/05/09    F CS-LTranv     2 Term 12/19/07    F CS-LTranv  N   1 Term 09/15/04    Judie Petit CS-LTranv       Obstetric Comments  Tubal Ligation 06/20/2010   Past Gynecological History: As per HPI. Periods: qmonth, regular, <1wk, somewhat heavy, not painful History of Pap Smear(s): Yes.   Last pap 2021, which was negative  Social History:  Social History   Socioeconomic History   Marital status: Single    Spouse name: Not on file   Number of children: Not on file   Years of education: Not on file   Highest education level: Not on file  Occupational History   Not on file  Tobacco Use   Smoking status: Never   Smokeless tobacco: Never  Vaping Use   Vaping status: Never Used  Substance and Sexual Activity   Alcohol use: No    Comment: one glass every two weeks   Drug use: Not Currently     Types: Marijuana   Sexual activity: Yes    Birth control/protection: Surgical, Condom  Other Topics Concern   Not on file  Social History Narrative   Not on file   Social Drivers of Health   Financial Resource Strain: Not on file  Food Insecurity: No Food Insecurity (10/15/2023)   Hunger Vital Sign    Worried About Running Out of Food in the Last Year: Never true    Ran Out of Food in the Last Year: Never true  Transportation Needs: No Transportation Needs (10/15/2023)   PRAPARE - Administrator, Civil Service (Medical): No    Lack of Transportation (Non-Medical): No  Physical Activity: Not on file  Stress: Not on file  Social Connections: Not on file  Intimate Partner Violence: Not on file    Family History:  Family History  Problem Relation Age of Onset   Hypertension Father    Healthy Mother     Medications Cosette Prindle had no medications administered during this visit. No current outpatient medications on file.   No current facility-administered medications for this visit.    Allergies Patient has no known allergies.   Physical Exam:  BP 116/82  Pulse 76   Ht 5\' 9"  (1.753 m)   Wt 247 lb 14.4 oz (112.4 kg)   LMP 10/08/2023 (Approximate) Comment: usually.  heavy, with small clots per pt  BMI 36.61 kg/m  Body mass index is 36.61 kg/m. General appearance: Well nourished, well developed female in no acute distress.  Neck:  Supple, normal appearance, and no thyromegaly  Cardiovascular: normal s1 and s2.  No murmurs, rubs or gallops. Respiratory:  Clear to auscultation bilateral. Normal respiratory effort Abdomen: positive bowel sounds and no masses, hernias; diffusely non tender to palpation, non distended Breasts: patient denies any s/s Neuro/Psych:  Normal mood and affect.  Skin:  Warm and dry.  Lymphatic:  No inguinal lymphadenopathy.   Cervical exam performed in the presence of a chaperone Pelvic exam: is not limited by body  habitus EGBUS: within normal limits Vagina: within normal limits and with no blood or discharge in the vault Cervix: normal appearing cervix without tenderness, discharge or lesions. Uterus:  nonenlarged and non tender Adnexa:  normal adnexa and no mass, fullness, tenderness Rectovaginal: deferred  Laboratory: none  Radiology: none  Assessment: patient doing well  Plan:  1. History of sexually transmitted disease (Primary) Desires screening  2. Well woman exam with routine gynecological exam Showed patient online how to sign up for PCP - Cytology - PAP( [place]) - Cervicovaginal ancillary only( [place])  3. History of recurrent BV No current s/s and sounds like it's more so asymptomatic, which I told her is debatable as to treat or not; I told her I generally favor not.  - Cervicovaginal ancillary only( [place]) - RPR+HBsAg+HCVAb+...  Orders Placed This Encounter  Procedures   RPR+HBsAg+HCVAb+...     Tyler Gallant MD Attending [facility])

## 2023-10-16 LAB — RPR+HBSAG+HCVAB+...
HIV Screen 4th Generation wRfx: NONREACTIVE
Hep C Virus Ab: NONREACTIVE
Hepatitis B Surface Ag: NEGATIVE
RPR Ser Ql: NONREACTIVE

## 2023-10-17 LAB — CERVICOVAGINAL ANCILLARY ONLY
Bacterial Vaginitis (gardnerella): NEGATIVE
Candida Glabrata: NEGATIVE
Candida Vaginitis: NEGATIVE
Chlamydia: NEGATIVE
Comment: NEGATIVE
Comment: NEGATIVE
Comment: NEGATIVE
Comment: NEGATIVE
Comment: NEGATIVE
Comment: NORMAL
Neisseria Gonorrhea: NEGATIVE
Trichomonas: NEGATIVE

## 2023-10-18 LAB — CYTOLOGY - PAP
Comment: NEGATIVE
Diagnosis: NEGATIVE
High risk HPV: NEGATIVE

## 2023-10-19 ENCOUNTER — Encounter: Payer: Self-pay | Admitting: Obstetrics and Gynecology

## 2023-12-07 ENCOUNTER — Ambulatory Visit: Payer: Self-pay | Admitting: Obstetrics and Gynecology

## 2024-02-05 ENCOUNTER — Encounter (HOSPITAL_COMMUNITY): Payer: Self-pay

## 2024-02-05 ENCOUNTER — Ambulatory Visit (HOSPITAL_COMMUNITY)
Admission: EM | Admit: 2024-02-05 | Discharge: 2024-02-05 | Disposition: A | Discharge status: Home / Self Care | Attending: Physician Assistant | Admitting: Physician Assistant

## 2024-02-05 DIAGNOSIS — Z113 Encounter for screening for infections with a predominantly sexual mode of transmission: Secondary | ICD-10-CM | POA: Diagnosis not present

## 2024-02-05 DIAGNOSIS — N949 Unspecified condition associated with female genital organs and menstrual cycle: Secondary | ICD-10-CM | POA: Diagnosis not present

## 2024-02-05 DIAGNOSIS — N898 Other specified noninflammatory disorders of vagina: Secondary | ICD-10-CM | POA: Diagnosis present

## 2024-02-05 LAB — HIV ANTIBODY (ROUTINE TESTING W REFLEX): HIV Screen 4th Generation wRfx: NONREACTIVE

## 2024-02-05 MED ORDER — FLUCONAZOLE 150 MG PO TABS: 150.0000 mg | ORAL_TABLET | ORAL | 0 refills | Status: AC | PRN

## 2024-02-05 NOTE — ED Provider Notes (Signed)
 [facility]    CSN: [id number] Arrival date & time: 02/05/24  1015      History   Chief Complaint Chief Complaint  Patient presents with   Vaginal Itching    HPI Jessica Owen is a 38 y.o. female.   Patient presents today with a several week history of vaginal irritation and itching.  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  She did have several lesions on her labia when the symptoms began and she has a picture of this on her phone that show several shallow ulcerated lesions in a cluster without vesicles.  She denies any history of HSV infection or similar episodes in the past.  She has not tried any over-the-counter medication for symptom management.  Denies any pelvic pain, fever, nausea, vomiting.  She had negative HIV and syphilis testing a few weeks ago but is open to repeat testing today.  She does not have a history of diabetes and does not take SGLT2 inhibitor.  No concern for pregnancy.    Past Medical History:  Diagnosis Date   Trichomonas vaginitis 05/05/2019   Viral pharyngitis 11/05/2017    Patient Active Problem List   Diagnosis Date Noted   History of recurrent vaginal discharge 08/05/2020    Past Surgical History:  Procedure Laterality Date   CESAREAN SECTION     x 4   TUBAL LIGATION      OB History     Gravida  4   Para  4   Term  4   Preterm  0   AB  0   Living  4      SAB      IAB      Ectopic      Multiple      Live Births           Obstetric Comments  Tubal Ligation 06/20/2010          Home Medications    Prior to Admission medications   Medication Sig Start Date End Date Taking? Authorizing Provider  fluconazole  (DIFLUCAN ) 150 MG tablet Take 1 tablet (150 mg total) by mouth every 3 (three) days as needed for up to 2 doses. 02/05/24  Yes Aldair Rickel K, PA-C  albuterol  (PROVENTIL  HFA;VENTOLIN  HFA) 108  (90 Base) MCG/ACT inhaler Inhale 1-2 puffs into the lungs every 4 (four) hours as needed for wheezing or shortness of breath. Patient not taking: Reported on 05/15/2019 08/06/18 09/01/19  Jamelle Lorrayne HERO, NP    Family History Family History  Problem Relation Age of Onset   Hypertension Father    Healthy Mother     Social History Social History   Tobacco Use   Smoking status: Never   Smokeless tobacco: Never  Vaping Use   Vaping status: Never Used  Substance Use Topics   Alcohol use: No    Comment: one glass every two weeks   Drug use: Not Currently    Types: Marijuana     Allergies   Patient has no known allergies.   Review of Systems Review of Systems  Constitutional:  Negative for activity change, appetite change, fatigue and fever.  Gastrointestinal:  Negative for abdominal pain, diarrhea, nausea and vomiting.  Genitourinary:  Positive for vaginal discharge (Chronic at baseline) and vaginal pain (Itching). Negative for dysuria, frequency, urgency and vaginal bleeding.     Physical Exam Triage Vital Signs ED  Triage Vitals  Encounter Vitals Group     BP 02/05/24 1055 (!) 141/84     Girls Systolic BP Percentile --      Girls Diastolic BP Percentile --      Boys Systolic BP Percentile --      Boys Diastolic BP Percentile --      Pulse Rate 02/05/24 1055 68     Resp 02/05/24 1055 16     Temp 02/05/24 1055 99.1 F (37.3 C)     Temp Source 02/05/24 1055 Oral     SpO2 02/05/24 1055 96 %     Weight --      Height --      Head Circumference --      Peak Flow --      Pain Score 02/05/24 1056 0     Pain Loc --      Pain Education --      Exclude from Growth Chart --    No data found.  Updated Vital Signs BP (!) 141/84 (BP Location: Right Arm)   Pulse 68   Temp 99.1 F (37.3 C) (Oral)   Resp 16   LMP 01/18/2024 (Approximate)   SpO2 96%   Visual Acuity Right Eye Distance:   Left Eye Distance:   Bilateral Distance:    Right Eye Near:   Left Eye Near:     Bilateral Near:     Physical Exam Vitals reviewed.  Constitutional:      General: She is awake. She is not in acute distress.    Appearance: Normal appearance. She is well-developed. She is not ill-appearing.     Comments: Very pleasant female appears stated age in no acute distress sitting comfortably in exam room  HENT:     Head: Normocephalic and atraumatic.  Cardiovascular:     Rate and Rhythm: Normal rate and regular rhythm.     Heart sounds: Normal heart sounds, S1 normal and S2 normal. No murmur heard. Pulmonary:     Effort: Pulmonary effort is normal.     Breath sounds: Normal breath sounds. No wheezing, rhonchi or rales.     Comments: Clear to auscultation bilaterally Genitourinary:    Comments: Exam deferred Psychiatric:        Behavior: Behavior is cooperative.      UC Treatments / Results  Labs (all labs ordered are listed, but only abnormal results are displayed) Labs Reviewed  RPR  HIV ANTIBODY (ROUTINE TESTING W REFLEX)  CERVICOVAGINAL ANCILLARY ONLY    EKG   Radiology No results found.  Procedures Procedures (including critical care time)  Medications Ordered in UC Medications - No data to display  Initial Impression / Assessment and Plan / UC Course  I have reviewed the triage vital signs and the nursing notes.  Pertinent labs & imaging results that were available during my care of the patient were reviewed by me and considered in my medical decision making (see chart for details).     Patient is well-appearing, afebrile, nontoxic, nontachycardic.  She declined exam but did provide a picture of the lesion she was concerned about.  We discussed that unfortunately, since they have healed and we are unable to sample the lesions we cannot confirm if they are related to an HSV infection.  Low suspicion for syphilis but will obtain RPR to rule this out as contributing to the symptoms.  HIV testing was obtained with additional blood work for screening.   I suspect that with the irritation and  itching she has a yeast infection and so we will start Diflucan  today and a second dose in 3 days if symptoms persist.  Swab was collected and we will contact her if need to arrange additional treatment based on her swab results.  We discussed that if she develops any additional lesions she should return immediately so we can test them and give her a definitive diagnosis.  Discussed that if anything worsens or changes she should be seen immediately.  Strict return precautions given.  Final Clinical Impressions(s) / UC Diagnoses   Final diagnoses:  Vaginal irritation  Screening examination for STI  Genital lesion, female     Discharge Instructions      We are treating you for yeast.  Start Diflucan  today and a second dose in 3 days if your symptoms persist.  We will contact you if any of your other testing is abnormal and we need to start additional treatment.  If you develop any blisters or lesions please return so we can do additional testing.  If anything worsens or changes and you have pelvic pain, abdominal pain, fever, nausea, vomiting you need to be seen immediately.    ED Prescriptions     Medication Sig Dispense Auth. Provider   fluconazole  (DIFLUCAN ) 150 MG tablet Take 1 tablet (150 mg total) by mouth every 3 (three) days as needed for up to 2 doses. 2 tablet Gunter Conde K, PA-C      PDMP not reviewed this encounter.   Sherrell Rocky POUR, PA-C 02/05/24 1143

## 2024-02-05 NOTE — Discharge Instructions (Signed)
 We are treating you for yeast.  Start Diflucan  today and a second dose in 3 days if your symptoms persist.  We will contact you if any of your other testing is abnormal and we need to start additional treatment.  If you develop any blisters or lesions please return so we can do additional testing.  If anything worsens or changes and you have pelvic pain, abdominal pain, fever, nausea, vomiting you need to be seen immediately.

## 2024-02-05 NOTE — ED Triage Notes (Signed)
 Patient here today with c/o vaginal itching X 2 weeks. Patient tried using an aloe vera plant to soothe it with no relief. Denies discharge.

## 2024-02-06 ENCOUNTER — Ambulatory Visit: Payer: Self-pay

## 2024-02-06 LAB — CERVICOVAGINAL ANCILLARY ONLY
Bacterial Vaginitis (gardnerella): NEGATIVE
Candida Glabrata: NEGATIVE
Candida Vaginitis: POSITIVE — AB
Chlamydia: NEGATIVE
Comment: NEGATIVE
Comment: NEGATIVE
Comment: NEGATIVE
Comment: NEGATIVE
Comment: NEGATIVE
Comment: NORMAL
Neisseria Gonorrhea: NEGATIVE
Trichomonas: NEGATIVE

## 2024-02-06 LAB — RPR: RPR Ser Ql: NONREACTIVE
